# Patient Record
Sex: Male | Born: 1950 | Race: White | Hispanic: No | Marital: Married | Smoking: Former smoker
Health system: Southern US, Community
[De-identification: ages and names within clinical notes are randomized; demographics above are authoritative.]

## PROBLEM LIST (undated history)

## (undated) DIAGNOSIS — Z7982 Long term (current) use of aspirin: Secondary | ICD-10-CM

## (undated) DIAGNOSIS — L570 Actinic keratosis: Secondary | ICD-10-CM

## (undated) DIAGNOSIS — R200 Anesthesia of skin: Secondary | ICD-10-CM

## (undated) DIAGNOSIS — K219 Gastro-esophageal reflux disease without esophagitis: Secondary | ICD-10-CM

## (undated) DIAGNOSIS — I272 Pulmonary hypertension, unspecified: Secondary | ICD-10-CM

## (undated) DIAGNOSIS — I6789 Other cerebrovascular disease: Secondary | ICD-10-CM

## (undated) DIAGNOSIS — I5189 Other ill-defined heart diseases: Secondary | ICD-10-CM

## (undated) DIAGNOSIS — Z87442 Personal history of urinary calculi: Secondary | ICD-10-CM

## (undated) DIAGNOSIS — K7689 Other specified diseases of liver: Secondary | ICD-10-CM

## (undated) DIAGNOSIS — M47816 Spondylosis without myelopathy or radiculopathy, lumbar region: Secondary | ICD-10-CM

## (undated) DIAGNOSIS — Z860101 Personal history of adenomatous and serrated colon polyps: Secondary | ICD-10-CM

## (undated) DIAGNOSIS — D329 Benign neoplasm of meninges, unspecified: Secondary | ICD-10-CM

## (undated) DIAGNOSIS — G4733 Obstructive sleep apnea (adult) (pediatric): Secondary | ICD-10-CM

## (undated) DIAGNOSIS — C4492 Squamous cell carcinoma of skin, unspecified: Secondary | ICD-10-CM

## (undated) DIAGNOSIS — I10 Essential (primary) hypertension: Secondary | ICD-10-CM

## (undated) DIAGNOSIS — E782 Mixed hyperlipidemia: Secondary | ICD-10-CM

## (undated) DIAGNOSIS — N281 Cyst of kidney, acquired: Secondary | ICD-10-CM

## (undated) DIAGNOSIS — I2510 Atherosclerotic heart disease of native coronary artery without angina pectoris: Secondary | ICD-10-CM

## (undated) DIAGNOSIS — R600 Localized edema: Secondary | ICD-10-CM

## (undated) DIAGNOSIS — Z8719 Personal history of other diseases of the digestive system: Secondary | ICD-10-CM

## (undated) DIAGNOSIS — N433 Hydrocele, unspecified: Secondary | ICD-10-CM

## (undated) DIAGNOSIS — R42 Dizziness and giddiness: Secondary | ICD-10-CM

## (undated) DIAGNOSIS — H409 Unspecified glaucoma: Secondary | ICD-10-CM

## (undated) DIAGNOSIS — M1712 Unilateral primary osteoarthritis, left knee: Secondary | ICD-10-CM

## (undated) DIAGNOSIS — N529 Male erectile dysfunction, unspecified: Secondary | ICD-10-CM

## (undated) DIAGNOSIS — N200 Calculus of kidney: Secondary | ICD-10-CM

## (undated) DIAGNOSIS — R0609 Other forms of dyspnea: Secondary | ICD-10-CM

## (undated) DIAGNOSIS — A6000 Herpesviral infection of urogenital system, unspecified: Secondary | ICD-10-CM

## (undated) DIAGNOSIS — R918 Other nonspecific abnormal finding of lung field: Secondary | ICD-10-CM

## (undated) DIAGNOSIS — R001 Bradycardia, unspecified: Secondary | ICD-10-CM

## (undated) DIAGNOSIS — I7300 Raynaud's syndrome without gangrene: Secondary | ICD-10-CM

## (undated) DIAGNOSIS — K2210 Ulcer of esophagus without bleeding: Secondary | ICD-10-CM

## (undated) DIAGNOSIS — K449 Diaphragmatic hernia without obstruction or gangrene: Secondary | ICD-10-CM

## (undated) DIAGNOSIS — Z8546 Personal history of malignant neoplasm of prostate: Secondary | ICD-10-CM

## (undated) DIAGNOSIS — I700 Atherosclerosis of aorta: Secondary | ICD-10-CM

## (undated) DIAGNOSIS — K2270 Barrett's esophagus without dysplasia: Secondary | ICD-10-CM

## (undated) DIAGNOSIS — Z8589 Personal history of malignant neoplasm of other organs and systems: Secondary | ICD-10-CM

## (undated) DIAGNOSIS — R4189 Other symptoms and signs involving cognitive functions and awareness: Secondary | ICD-10-CM

## (undated) DIAGNOSIS — C801 Malignant (primary) neoplasm, unspecified: Secondary | ICD-10-CM

## (undated) HISTORY — PX: BACK SURGERY: SHX140

## (undated) HISTORY — PX: OTHER SURGICAL HISTORY: SHX169

## (undated) HISTORY — PX: ESOPHAGOGASTRODUODENOSCOPY: SHX1529

## (undated) HISTORY — PX: HERNIA REPAIR: SHX51

## (undated) HISTORY — PX: COLONOSCOPY: SHX174

## (undated) HISTORY — PX: INGUINAL HERNIA REPAIR: SHX194

## (undated) HISTORY — PX: CARPAL TUNNEL RELEASE: SHX101

---
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
## 1977-05-19 HISTORY — PX: KNEE ARTHROSCOPY: SUR90

## 1981-05-19 HISTORY — PX: OTHER SURGICAL HISTORY: SHX169

## 2004-10-17 HISTORY — PX: OTHER SURGICAL HISTORY: SHX169

## 2004-10-28 ENCOUNTER — Ambulatory Visit: Payer: Self-pay | Admitting: Unknown Physician Specialty

## 2005-05-16 ENCOUNTER — Ambulatory Visit: Payer: Self-pay | Admitting: General Surgery

## 2005-06-24 ENCOUNTER — Ambulatory Visit: Payer: Self-pay | Admitting: General Surgery

## 2005-08-27 ENCOUNTER — Other Ambulatory Visit: Payer: Self-pay

## 2005-08-27 ENCOUNTER — Emergency Department: Payer: Self-pay | Admitting: Emergency Medicine

## 2008-05-19 DIAGNOSIS — Z8546 Personal history of malignant neoplasm of prostate: Secondary | ICD-10-CM

## 2008-05-19 HISTORY — DX: Personal history of malignant neoplasm of prostate: Z85.46

## 2008-06-19 HISTORY — PX: OTHER SURGICAL HISTORY: SHX169

## 2008-06-19 HISTORY — PX: PROSTATECTOMY: SHX69

## 2008-07-24 ENCOUNTER — Ambulatory Visit: Payer: Self-pay | Admitting: Family Medicine

## 2008-08-29 ENCOUNTER — Ambulatory Visit: Payer: Self-pay | Admitting: Urology

## 2008-09-05 ENCOUNTER — Ambulatory Visit: Payer: Self-pay | Admitting: Cardiology

## 2008-10-27 ENCOUNTER — Ambulatory Visit: Payer: Self-pay | Admitting: Urology

## 2008-11-07 ENCOUNTER — Inpatient Hospital Stay: Payer: Self-pay | Admitting: Urology

## 2009-11-12 ENCOUNTER — Ambulatory Visit: Payer: Self-pay | Admitting: General Surgery

## 2009-12-03 ENCOUNTER — Ambulatory Visit: Payer: Self-pay | Admitting: General Surgery

## 2010-04-26 ENCOUNTER — Ambulatory Visit: Payer: Self-pay | Admitting: Unknown Physician Specialty

## 2015-01-23 ENCOUNTER — Other Ambulatory Visit: Payer: Self-pay | Admitting: Adult Health

## 2015-01-23 DIAGNOSIS — R17 Unspecified jaundice: Secondary | ICD-10-CM

## 2015-01-23 DIAGNOSIS — R1011 Right upper quadrant pain: Secondary | ICD-10-CM

## 2015-01-25 ENCOUNTER — Ambulatory Visit
Admission: RE | Admit: 2015-01-25 | Discharge: 2015-01-25 | Disposition: A | Discharge status: Home / Self Care | Payer: Managed Care, Other (non HMO) | Source: Ambulatory Visit | Attending: Family Medicine | Admitting: Family Medicine

## 2015-01-25 DIAGNOSIS — R17 Unspecified jaundice: Secondary | ICD-10-CM

## 2015-01-25 DIAGNOSIS — R1011 Right upper quadrant pain: Secondary | ICD-10-CM | POA: Diagnosis present

## 2016-09-16 DEATH — deceased

## 2018-11-24 HISTORY — PX: OTHER SURGICAL HISTORY: SHX169

## 2019-07-24 ENCOUNTER — Ambulatory Visit: Payer: Managed Care, Other (non HMO) | Attending: Internal Medicine

## 2019-07-24 DIAGNOSIS — Z23 Encounter for immunization: Secondary | ICD-10-CM

## 2019-07-24 NOTE — Progress Notes (Signed)
   Covid-19 Vaccination Clinic  Name:  Vincent Patton    MRN: [id number] DOB: 07-20-1950  07/24/2019  [name] was observed post Covid-19 immunization for 15 minutes without incident. He was provided with Vaccine Information Sheet and instruction to access the V-Safe system.   [name] was instructed to call 911 with any severe reactions post vaccine: Marland Kitchen Difficulty breathing  . Swelling of face and throat  . A fast heartbeat  . A bad rash all over body  . Dizziness and weakness   Immunizations Administered    Name Date Dose VIS Date Route   Pfizer COVID-19 Vaccine 07/24/2019  9:06 AM 0.3 mL 04/29/2019 Intramuscular   Manufacturer: [employer], [employer]   Lot: [id number]   NDC: [address]-4

## 2019-08-15 ENCOUNTER — Ambulatory Visit: Payer: Managed Care, Other (non HMO) | Attending: Internal Medicine

## 2019-08-15 DIAGNOSIS — Z23 Encounter for immunization: Secondary | ICD-10-CM

## 2019-08-15 NOTE — Progress Notes (Signed)
   Covid-19 Vaccination Clinic  Name:  Vincent Patton    MRN: [id number] DOB: [birth year]  08/15/2019  [name] was observed post Covid-19 immunization for 15 minutes without incident. He was provided with Vaccine Information Sheet and instruction to access the V-Safe system.   [name] was instructed to call 911 with any severe reactions post vaccine: Marland Kitchen Difficulty breathing  . Swelling of face and throat  . A fast heartbeat  . A bad rash all over body  . Dizziness and weakness   Immunizations Administered    Name Date Dose VIS Date Route   Pfizer COVID-19 Vaccine 08/15/2019  8:33 AM 0.3 mL 04/29/2019 Intramuscular   Manufacturer: [employer], [employer]   Lot: [id number]   NDC: [address]-8

## 2020-04-23 ENCOUNTER — Ambulatory Visit: Payer: Medicare Other | Attending: Neurology

## 2020-04-23 DIAGNOSIS — G4761 Periodic limb movement disorder: Secondary | ICD-10-CM | POA: Insufficient documentation

## 2020-04-23 DIAGNOSIS — G4733 Obstructive sleep apnea (adult) (pediatric): Secondary | ICD-10-CM | POA: Insufficient documentation

## 2020-04-23 DIAGNOSIS — R0683 Snoring: Secondary | ICD-10-CM | POA: Diagnosis present

## 2020-04-24 ENCOUNTER — Other Ambulatory Visit: Payer: Self-pay

## 2020-08-24 ENCOUNTER — Other Ambulatory Visit: Payer: Self-pay

## 2020-08-24 ENCOUNTER — Emergency Department: Payer: Medicare Other

## 2020-08-24 ENCOUNTER — Emergency Department
Admission: EM | Admit: 2020-08-24 | Discharge: 2020-08-24 | Disposition: A | Discharge status: Home / Self Care | Payer: Medicare Other | Attending: Emergency Medicine | Admitting: Emergency Medicine

## 2020-08-24 ENCOUNTER — Encounter: Payer: Self-pay | Admitting: Intensive Care

## 2020-08-24 DIAGNOSIS — Z87891 Personal history of nicotine dependence: Secondary | ICD-10-CM | POA: Diagnosis not present

## 2020-08-24 DIAGNOSIS — R0789 Other chest pain: Secondary | ICD-10-CM | POA: Diagnosis not present

## 2020-08-24 DIAGNOSIS — R079 Chest pain, unspecified: Secondary | ICD-10-CM

## 2020-08-24 LAB — CBC
HCT: 43.6 % (ref 39.0–52.0)
Hemoglobin: 14.9 g/dL (ref 13.0–17.0)
MCH: 31.2 pg (ref 26.0–34.0)
MCHC: 34.2 g/dL (ref 30.0–36.0)
MCV: 91.4 fL (ref 80.0–100.0)
Platelets: 141 10*3/uL — ABNORMAL LOW (ref 150–400)
RBC: 4.77 MIL/uL (ref 4.22–5.81)
RDW: 12.3 % (ref 11.5–15.5)
WBC: 5 10*3/uL (ref 4.0–10.5)
nRBC: 0 % (ref 0.0–0.2)

## 2020-08-24 LAB — BASIC METABOLIC PANEL
Anion gap: 5 (ref 5–15)
BUN: 13 mg/dL (ref 8–23)
CO2: 28 mmol/L (ref 22–32)
Calcium: 8.6 mg/dL — ABNORMAL LOW (ref 8.9–10.3)
Chloride: 105 mmol/L (ref 98–111)
Creatinine, Ser: 0.84 mg/dL (ref 0.61–1.24)
GFR, Estimated: >60 mL/min (ref >60)
Glucose, Bld: 168 mg/dL — ABNORMAL HIGH (ref 70–99)
Potassium: 3.5 mmol/L (ref 3.5–5.1)
Sodium: 138 mmol/L (ref 135–145)

## 2020-08-24 LAB — TROPONIN I (HIGH SENSITIVITY)
Troponin I (High Sensitivity): 12 ng/L (ref <18)
Troponin I (High Sensitivity): 16 ng/L (ref <18)

## 2020-08-24 NOTE — ED Triage Notes (Signed)
Pt c/o central cp for a few days with no radiation. Describes as dull ache

## 2020-08-24 NOTE — ED Provider Notes (Signed)
[facility] Emergency Department Provider Note   ____________________________________________   I have reviewed the triage vital signs and the nursing notes.   HISTORY  Chief Complaint Chest Pain   History limited by: Not Limited   HPI Vincent Patton is a 70 y.o. male who presents to the emergency department today because of concern for chest discomfort. Located in the center part of his chest. It does not radiate to his jaw or arm. He states that it is intermittent. It will come on with exertion and go away with rest. He denies any associated nausea, shortness of breath or diaphoresis. It has been happening over the past few weeks. The patient denies any fevers. No swelling. Does have a history of acid reflux and recently started on antacid which has been helping with that.    Records reviewed. Per medical record review patient has a history of GERD  History reviewed. No pertinent past medical history.  There are no problems to display for this patient.   History reviewed. No pertinent surgical history.  Prior to Admission medications   Not on File    Allergies Lodine [etodolac]  History reviewed. No pertinent family history.  Social History Social History   Tobacco Use  . Smoking status: Former Smoker    Types: Cigarettes  . Smokeless tobacco: Never Used  Substance Use Topics  . Alcohol use: Yes    Comment: beer once a month  . Drug use: Never    Review of Systems Constitutional: No fever/chills Eyes: No visual changes. ENT: No sore throat. Cardiovascular: Positive for chest pain. Respiratory: Denies shortness of breath. Gastrointestinal: No abdominal pain.  No nausea, no vomiting.  No diarrhea.   Genitourinary: Negative for dysuria. Musculoskeletal: Negative for back pain. Skin: Negative for rash. Neurological: Negative for headaches, focal weakness or numbness.  ____________________________________________   PHYSICAL  EXAM:  VITAL SIGNS: ED Triage Vitals  Enc Vitals Group     BP 08/24/20 1525 (!) 154/85     Pulse Rate 08/24/20 1525 84     Resp 08/24/20 1525 20     Temp 08/24/20 1525 98.2 F (36.8 C)     Temp Source 08/24/20 1525 Oral     SpO2 08/24/20 1525 96 %     Weight 08/24/20 1526 180 lb (81.6 kg)     Height 08/24/20 1526 6' (1.829 m)     Head Circumference --      Peak Flow --      Pain Score 08/24/20 1525 4   Constitutional: Alert and oriented.  Eyes: Conjunctivae are normal.  ENT      Head: Normocephalic and atraumatic.      Nose: No congestion/rhinnorhea.      Mouth/Throat: Mucous membranes are moist.      Neck: No stridor. Hematological/Lymphatic/Immunilogical: No cervical lymphadenopathy. Cardiovascular: Normal rate, regular rhythm.  No murmurs, rubs, or gallops.  Respiratory: Normal respiratory effort without tachypnea nor retractions. Breath sounds are clear and equal bilaterally. No wheezes/rales/rhonchi. Gastrointestinal: Soft and non tender. No rebound. No guarding.  Genitourinary: Deferred Musculoskeletal: Normal range of motion in all extremities. No lower extremity edema. Neurologic:  Normal speech and language. No gross focal neurologic deficits are appreciated.  Skin:  Skin is warm, dry and intact. No rash noted. Psychiatric: Mood and affect are normal. Speech and behavior are normal. Patient exhibits appropriate insight and judgment.  ____________________________________________    LABS (pertinent positives/negatives)  CBC wbc 5.0, hgb 14.9, plt 141 Trop hs 12 to  16 CMP wnl except glu 168, ca 8.6 ____________________________________________   EKG  I, Phineas Semen, attending physician, personally viewed and interpreted this EKG  EKG Time: 1520 Rate: 79 Rhythm: normal sinus rhythm Axis: normal Intervals: qtc 433 QRS: narrow ST changes: no st elevation, t wave inversion V1 Impression: abnormal ekg  ____________________________________________     RADIOLOGY  CXR No acute abnormality  ____________________________________________   PROCEDURES  Procedures  ____________________________________________   INITIAL IMPRESSION / ASSESSMENT AND PLAN / ED COURSE  Pertinent labs & imaging results that were available during my care of the patient were reviewed by me and considered in my medical decision making (see chart for details).   Patient presented to the emergency department today because of concern for chest pain. It has been intermittent over the past few weeks. On exam today patient without any chest discomfort. Troponin negative x 2. EKG without concerning acute findings. At this time somewhat unclear etiology however I did discuss with the patient importance of cardiology follow up. We discussed starting a low dose aspirin and I do not think there would be any concern for taking it at this time. Discussed return precautions.  ____________________________________________   FINAL CLINICAL IMPRESSION(S) / ED DIAGNOSES  Final diagnoses:  Nonspecific chest pain     Note: This dictation was prepared with Dragon dictation. Any transcriptional errors that result from this process are unintentional     Phineas Semen, MD 08/24/20 1806

## 2020-08-24 NOTE — Discharge Instructions (Addendum)
As we discussed please contact Dr. America Brown office for close follow up. You can also start taking low dose aspirin (81mg ) daily. Please seek medical attention for any worsening chest pain, shortness of breath, pain in your jaw or arm, unusual sweating or any other new or concerning symptoms.

## 2020-08-24 NOTE — ED Notes (Signed)
continuous cardiac and pulse ox monitoring in use.

## 2021-07-29 ENCOUNTER — Other Ambulatory Visit: Payer: Self-pay | Admitting: Physician Assistant

## 2021-07-29 DIAGNOSIS — R0789 Other chest pain: Secondary | ICD-10-CM

## 2021-07-29 DIAGNOSIS — R6889 Other general symptoms and signs: Secondary | ICD-10-CM

## 2021-07-29 DIAGNOSIS — I10 Essential (primary) hypertension: Secondary | ICD-10-CM

## 2021-08-01 ENCOUNTER — Ambulatory Visit
Admission: RE | Admit: 2021-08-01 | Discharge: 2021-08-01 | Disposition: A | Discharge status: Home / Self Care | Payer: Medicare Other | Source: Ambulatory Visit | Attending: Physician Assistant | Admitting: Physician Assistant

## 2021-08-01 ENCOUNTER — Other Ambulatory Visit: Payer: Self-pay | Admitting: Physician Assistant

## 2021-08-01 DIAGNOSIS — R6889 Other general symptoms and signs: Secondary | ICD-10-CM | POA: Diagnosis not present

## 2021-08-01 DIAGNOSIS — Z87891 Personal history of nicotine dependence: Secondary | ICD-10-CM | POA: Insufficient documentation

## 2021-08-01 DIAGNOSIS — R0601 Orthopnea: Secondary | ICD-10-CM | POA: Insufficient documentation

## 2021-08-01 DIAGNOSIS — E785 Hyperlipidemia, unspecified: Secondary | ICD-10-CM | POA: Diagnosis not present

## 2021-08-01 DIAGNOSIS — I10 Essential (primary) hypertension: Secondary | ICD-10-CM | POA: Insufficient documentation

## 2021-08-01 DIAGNOSIS — K219 Gastro-esophageal reflux disease without esophagitis: Secondary | ICD-10-CM | POA: Diagnosis not present

## 2021-08-01 DIAGNOSIS — R0602 Shortness of breath: Secondary | ICD-10-CM | POA: Insufficient documentation

## 2021-08-01 DIAGNOSIS — R931 Abnormal findings on diagnostic imaging of heart and coronary circulation: Secondary | ICD-10-CM | POA: Insufficient documentation

## 2021-08-01 DIAGNOSIS — R002 Palpitations: Secondary | ICD-10-CM | POA: Insufficient documentation

## 2021-08-01 DIAGNOSIS — I498 Other specified cardiac arrhythmias: Secondary | ICD-10-CM | POA: Diagnosis not present

## 2021-08-01 DIAGNOSIS — I700 Atherosclerosis of aorta: Secondary | ICD-10-CM | POA: Insufficient documentation

## 2021-08-01 DIAGNOSIS — Z8546 Personal history of malignant neoplasm of prostate: Secondary | ICD-10-CM | POA: Insufficient documentation

## 2021-08-01 DIAGNOSIS — R9389 Abnormal findings on diagnostic imaging of other specified body structures: Secondary | ICD-10-CM | POA: Diagnosis present

## 2021-08-01 DIAGNOSIS — Z79899 Other long term (current) drug therapy: Secondary | ICD-10-CM | POA: Diagnosis not present

## 2021-08-01 DIAGNOSIS — K449 Diaphragmatic hernia without obstruction or gangrene: Secondary | ICD-10-CM | POA: Insufficient documentation

## 2021-08-01 DIAGNOSIS — Z85828 Personal history of other malignant neoplasm of skin: Secondary | ICD-10-CM | POA: Insufficient documentation

## 2021-08-01 DIAGNOSIS — R918 Other nonspecific abnormal finding of lung field: Secondary | ICD-10-CM | POA: Diagnosis not present

## 2021-08-01 DIAGNOSIS — I2510 Atherosclerotic heart disease of native coronary artery without angina pectoris: Secondary | ICD-10-CM | POA: Diagnosis not present

## 2021-08-01 DIAGNOSIS — Z8249 Family history of ischemic heart disease and other diseases of the circulatory system: Secondary | ICD-10-CM | POA: Diagnosis not present

## 2021-08-01 DIAGNOSIS — R0789 Other chest pain: Secondary | ICD-10-CM | POA: Diagnosis present

## 2021-08-01 DIAGNOSIS — R42 Dizziness and giddiness: Secondary | ICD-10-CM | POA: Insufficient documentation

## 2021-08-01 DIAGNOSIS — R0609 Other forms of dyspnea: Secondary | ICD-10-CM | POA: Insufficient documentation

## 2021-08-01 DIAGNOSIS — Z77090 Contact with and (suspected) exposure to asbestos: Secondary | ICD-10-CM | POA: Insufficient documentation

## 2021-08-01 LAB — POCT I-STAT CREATININE: Creatinine, Ser: 0.8 mg/dL (ref 0.61–1.24)

## 2021-08-01 MED ORDER — IOHEXOL 350 MG/ML SOLN
80.0000 mL | Freq: Once | INTRAVENOUS | Status: AC | PRN
  Administered 2021-08-01: 75 mL via INTRAVENOUS

## 2021-08-01 MED ORDER — NITROGLYCERIN 0.4 MG SL SUBL
0.8000 mg | SUBLINGUAL_TABLET | Freq: Once | SUBLINGUAL | Status: AC
  Administered 2021-08-01: 0.8 mg via SUBLINGUAL

## 2021-08-01 MED ORDER — METOPROLOL TARTRATE 5 MG/5ML IV SOLN
10.0000 mg | Freq: Once | INTRAVENOUS | Status: AC
  Administered 2021-08-01: 10 mg via INTRAVENOUS

## 2021-08-01 NOTE — Progress Notes (Signed)
Patient tolerated CT well. Drank water after. Vital signs stable encourage to drink water throughout day.Reasons explained and verbalized understanding. Ambulated steady gait.  

## 2021-08-02 DIAGNOSIS — R931 Abnormal findings on diagnostic imaging of heart and coronary circulation: Secondary | ICD-10-CM | POA: Diagnosis not present

## 2022-02-20 ENCOUNTER — Other Ambulatory Visit: Payer: Self-pay | Admitting: Neurology

## 2022-02-20 ENCOUNTER — Other Ambulatory Visit (HOSPITAL_COMMUNITY): Payer: Self-pay | Admitting: Neurology

## 2022-02-20 DIAGNOSIS — R4189 Other symptoms and signs involving cognitive functions and awareness: Secondary | ICD-10-CM

## 2022-02-24 ENCOUNTER — Ambulatory Visit (HOSPITAL_COMMUNITY)
Admission: RE | Admit: 2022-02-24 | Discharge: 2022-02-24 | Disposition: A | Discharge status: Home / Self Care | Payer: Medicare Other | Source: Ambulatory Visit | Attending: Neurology | Admitting: Neurology

## 2022-02-24 DIAGNOSIS — R4189 Other symptoms and signs involving cognitive functions and awareness: Secondary | ICD-10-CM | POA: Insufficient documentation

## 2022-04-22 ENCOUNTER — Encounter: Payer: Self-pay | Admitting: Podiatry

## 2022-04-22 ENCOUNTER — Ambulatory Visit: Payer: Medicare Other | Admitting: Podiatry

## 2022-04-22 VITALS — BP 170/97 | HR 64

## 2022-04-22 DIAGNOSIS — S99929A Unspecified injury of unspecified foot, initial encounter: Secondary | ICD-10-CM

## 2022-04-22 NOTE — Progress Notes (Signed)
   Chief Complaint  Patient presents with   Foot Pain    "I'm having problems with my big toenail." N - toenail problem L - hallux rt D - 3 weeks O - suddenly C - hurts sometime, discolored A - walking sometime T - my wife dug in it a little bit    HPI: 71 y.o. male presenting today as a new patient for evaluation of discoloration to the right hallux nail plate that began about 3 weeks ago.  Patient states that about 4 weeks ago he sustained a trip and fall injury.  This may have elicited the discoloration of the toenail plate.  It is slightly tender.  He presents for further treatment and evaluation  No past medical history on file.  No past surgical history on file.  Allergies  Allergen Reactions   Lodine [Etodolac]     Physical Exam: General: The patient is alert and oriented x3 in no acute distress.  Dermatology: Discoloration noted to the right hallux nail plate with some dried subungual blood.  Clinically this looks like history of trauma which correlates with the patient's history of the trip and fall injury about 4 weeks ago.  It is well adhered to the underlying nailbed  Vascular: Palpable pedal pulses bilaterally. Capillary refill within normal limits.  Negative for any significant edema or erythema  Neurological: Grossly intact via light touch  Musculoskeletal Exam: No pedal deformities noted   Assessment: 1.  Traumatic injury to right hallux nail plate; stable   Plan of Care:  1. Patient evaluated.  2.  Light debridement of the nail plate was performed today using a nail nipper without incident or bleeding 3.  The toenail plate is stable.  Recommend conservative treatment and simple observation as the nail grows out 4.  Return to clinic as needed     Felecia Shelling, DPM [facility]  Dr. Felecia Shelling, DPM    [address] [place], [place] [address]                Office [phone]  Fax [phone]

## 2022-05-29 ENCOUNTER — Other Ambulatory Visit: Payer: Self-pay | Admitting: Physician Assistant

## 2022-05-29 DIAGNOSIS — Z86018 Personal history of other benign neoplasm: Secondary | ICD-10-CM

## 2022-05-29 DIAGNOSIS — M5136 Other intervertebral disc degeneration, lumbar region: Secondary | ICD-10-CM

## 2022-05-29 DIAGNOSIS — M5416 Radiculopathy, lumbar region: Secondary | ICD-10-CM

## 2022-05-29 DIAGNOSIS — M51369 Other intervertebral disc degeneration, lumbar region without mention of lumbar back pain or lower extremity pain: Secondary | ICD-10-CM

## 2022-06-12 ENCOUNTER — Ambulatory Visit
Admission: RE | Admit: 2022-06-12 | Discharge: 2022-06-12 | Disposition: A | Discharge status: Home / Self Care | Payer: Medicare Other | Source: Ambulatory Visit | Attending: Physician Assistant | Admitting: Physician Assistant

## 2022-06-12 DIAGNOSIS — Z86018 Personal history of other benign neoplasm: Secondary | ICD-10-CM

## 2022-06-12 DIAGNOSIS — M5136 Other intervertebral disc degeneration, lumbar region: Secondary | ICD-10-CM

## 2022-06-12 DIAGNOSIS — M5416 Radiculopathy, lumbar region: Secondary | ICD-10-CM

## 2022-06-12 MED ORDER — GADOBENATE DIMEGLUMINE 529 MG/ML IV SOLN
15.0000 mL | Freq: Once | INTRAVENOUS | Status: AC | PRN
  Administered 2022-06-12: 15 mL via INTRAVENOUS

## 2022-12-30 ENCOUNTER — Other Ambulatory Visit: Payer: Self-pay | Admitting: Student

## 2022-12-30 DIAGNOSIS — D329 Benign neoplasm of meninges, unspecified: Secondary | ICD-10-CM

## 2023-01-01 ENCOUNTER — Ambulatory Visit
Admission: RE | Admit: 2023-01-01 | Discharge: 2023-01-01 | Disposition: A | Discharge status: Home / Self Care | Payer: Medicare Other | Source: Ambulatory Visit | Attending: Student | Admitting: Student

## 2023-01-01 DIAGNOSIS — D329 Benign neoplasm of meninges, unspecified: Secondary | ICD-10-CM | POA: Insufficient documentation

## 2023-01-01 MED ORDER — GADOBUTROL 1 MMOL/ML IV SOLN
6.0000 mL | Freq: Once | INTRAVENOUS | Status: AC | PRN
  Administered 2023-01-01: 6 mL via INTRAVENOUS

## 2023-05-15 ENCOUNTER — Ambulatory Visit: Payer: Medicare Other | Admitting: Urology

## 2023-05-15 ENCOUNTER — Encounter: Payer: Self-pay | Admitting: Urology

## 2023-05-15 VITALS — Ht 72.0 in | Wt 180.0 lb

## 2023-05-15 DIAGNOSIS — Z8546 Personal history of malignant neoplasm of prostate: Secondary | ICD-10-CM | POA: Diagnosis not present

## 2023-05-15 DIAGNOSIS — R399 Unspecified symptoms and signs involving the genitourinary system: Secondary | ICD-10-CM | POA: Diagnosis not present

## 2023-05-15 LAB — URINALYSIS, COMPLETE
Bilirubin, UA: NEGATIVE
Glucose, UA: NEGATIVE
Ketones, UA: NEGATIVE
Leukocytes,UA: NEGATIVE
Nitrite, UA: NEGATIVE
Protein,UA: NEGATIVE
RBC, UA: NEGATIVE
Specific Gravity, UA: 1.025 (ref 1.005–1.030)
Urobilinogen, Ur: 0.2 mg/dL (ref 0.2–1.0)
pH, UA: 5.5 (ref 5.0–7.5)

## 2023-05-15 LAB — MICROSCOPIC EXAMINATION

## 2023-05-15 LAB — BLADDER SCAN AMB NON-IMAGING: Scan Result: 0

## 2023-05-15 NOTE — Progress Notes (Signed)
I, Maysun Anabel Bene, acting as a scribe for Riki Altes, MD., have documented all relevant documentation on the behalf of Riki Altes, MD, as directed by Riki Altes, MD while in the presence of Riki Altes, MD.  05/15/2023 10:14 AM   Vincent Patton 1950-08-13 [id number]  Chief Complaint  Patient presents with   Urinary Frequency    HPI: Vincent Patton is a 72 y.o. male self-referred for evaluation of urinary hesitancy and a weak urinary stream.  Status post radical prostatectomy around 2010 by Dr. Achilles Dunk. He had no post-operative problems, urinary incontinence, urethra stricture bladder neck contracture.  1 week prior to Thanksgiving, he had onset of urinary hesitancy and a slow urinary stream, along with nocturia x2. He saw PCP and had negative urinalysis and urine culture. Symptoms have subsequently resolved and he currently has no complaints. He has a flat decreased urinary stream, which is baseline, but is able to empty his bladder. No dysuria, gross hematuria.  He has ED and takes tadalafil. He could not tolerate thetadalafil secondary to back pain. PSA June 2024 was undetectable at <0.01   Home Medications:  Allergies as of 05/15/2023       Reactions   Tamsulosin Other (See Comments), Shortness Of Breath   Feels like he will pass out while on it with SOB   Lodine [etodolac]         Medication List        Accurate as of May 15, 2023 10:14 AM. If you have any questions, ask your nurse or doctor.          amLODipine 5 MG tablet Commonly known as: NORVASC Take 5 mg by mouth daily.   aspirin 81 MG chewable tablet Chew by mouth.   cyanocobalamin 1000 MCG tablet Commonly known as: VITAMIN B12 Take by mouth.   memantine 5 MG tablet Commonly known as: NAMENDA Take 2.5 mg by mouth 2 (two) times daily.   oxybutynin 5 MG 24 hr tablet Commonly known as: DITROPAN-XL Take by mouth.   pantoprazole 40 MG tablet Commonly known as: PROTONIX Take  40 mg by mouth daily.   potassium chloride 10 MEQ tablet Commonly known as: KLOR-CON M Take by mouth.   sildenafil 20 MG tablet Commonly known as: REVATIO TAKE 2-5 TABLETS BY MOUTH ONCE DAILY AS NEEDED FOR SEXUAL ACTIVITY        Allergies:  Allergies  Allergen Reactions   Tamsulosin Other (See Comments) and Shortness Of Breath    Feels like he will pass out while on it with SOB   Lodine [Etodolac]     Social History:  reports that he has quit smoking. His smoking use included cigarettes. He has never used smokeless tobacco. He reports current alcohol use. He reports that he does not use drugs.   Physical Exam: Ht 6' (1.829 m)   Wt 180 lb (81.6 kg)   BMI 24.41 kg/m   Constitutional:  Alert and oriented, No acute distress. HEENT: [place] AT Respiratory: Normal respiratory effort, no increased work of breathing. Psychiatric: Normal mood and affect.  Urinalysis Dipstick/microscopy negative.   Assessment & Plan:    1. Lower urinary tract symptoms His symptoms he was experiencing last month have resolved. He has slight decreased urinary stream, however PVR today was 0 mL.  He will follow up as needed for recurrent lower urinary tract symptoms.   2. History of prostate cancer Undetectable PSA, approximately 15 years post-op.   I  have reviewed the above documentation for accuracy and completeness, and I agree with the above.   Riki Altes, MD  [facility] [address], [address] [place], [place] [address] [phone]

## 2023-06-04 ENCOUNTER — Encounter: Payer: Self-pay | Admitting: Urology

## 2023-06-04 ENCOUNTER — Ambulatory Visit: Payer: Medicare Other | Admitting: Urology

## 2023-06-04 VITALS — BP 129/78 | HR 67 | Ht 72.0 in | Wt 180.0 lb

## 2023-06-04 DIAGNOSIS — N5201 Erectile dysfunction due to arterial insufficiency: Secondary | ICD-10-CM

## 2023-06-04 DIAGNOSIS — N433 Hydrocele, unspecified: Secondary | ICD-10-CM | POA: Diagnosis not present

## 2023-06-04 NOTE — Progress Notes (Signed)
I, Maysun Anabel Bene, acting as a scribe for Riki Altes, MD., have documented all relevant documentation on the behalf of Riki Altes, MD,as directed by Riki Altes, MD while in the presence of Riki Altes, MD.  06/04/2023 8:58 PM   Maryclare Labrador [birth year] [id number]  Referring provider: Dorothey Baseman, MD [phone] [place],  [place] [address]  Chief Complaint  Patient presents with   Other   Urologic history: 1. History of prostate cancer  HPI: CHERYL PEFLEY is a 73 y.o. male for a follow-up visit for an evaluation of left hemiscrotal swelling.  Seen 05/15/2023 for a urinary hesitancy. He states he has had a several year history of left  left hemiscrotal swelling but did not mention at that visit. He has mild discomfort on occasions He also is using sildenafil up to 100 mg. Initially gets a good erection, however has difficulty maintaining the erection. He is unable to tolerate tadalafil, secondary to significant back pain.    Home Medications:  Allergies as of 06/04/2023       Reactions   Tamsulosin Other (See Comments), Shortness Of Breath   Feels like he will pass out while on it with SOB   Lodine [etodolac]         Medication List        Accurate as of June 04, 2023  8:58 PM. If you have any questions, ask your nurse or doctor.          amLODipine 5 MG tablet Commonly known as: NORVASC Take 5 mg by mouth daily.   aspirin 81 MG chewable tablet Chew by mouth.   cyanocobalamin 1000 MCG tablet Commonly known as: VITAMIN B12 Take by mouth.   memantine 5 MG tablet Commonly known as: NAMENDA Take 2.5 mg by mouth 2 (two) times daily.   oxybutynin 5 MG 24 hr tablet Commonly known as: DITROPAN-XL Take by mouth.   pantoprazole 40 MG tablet Commonly known as: PROTONIX Take 40 mg by mouth daily.   potassium chloride 10 MEQ tablet Commonly known as: KLOR-CON M Take by mouth.   sildenafil 20 MG tablet Commonly known as:  REVATIO TAKE 2-5 TABLETS BY MOUTH ONCE DAILY AS NEEDED FOR SEXUAL ACTIVITY        Allergies:  Allergies  Allergen Reactions   Tamsulosin Other (See Comments) and Shortness Of Breath    Feels like he will pass out while on it with SOB   Lodine [Etodolac]     Social History:  reports that he has quit smoking. His smoking use included cigarettes. He has never used smokeless tobacco. He reports current alcohol use. He reports that he does not use drugs.   Physical Exam: BP 129/78   Pulse 67   Ht 6' (1.829 m)   Wt 180 lb (81.6 kg)   BMI 24.41 kg/m   Constitutional:  Alert and oriented, No acute distress. HEENT: [place] AT Respiratory: Normal respiratory effort, no increased work of breathing. GU: Phallus without lesions. Right testes descended palpably normal. There is a small left hydrocele present, which is compressible and able to palpate the testes.  Skin: No rashes, bruises or suspicious lesions. Neurologic: Grossly intact, no focal deficits, moving all 4 extremities. Psychiatric: Normal mood and affect.  Assessment & Plan:    1. Left hydrocele The pathophysiology of hydrocele formation was discussed.  Options were discussed including observation, aspiration, and hydrocelectomy. The high recurrence rate of aspiration was discussed.  He  is minimally symptomatic and has elected observation.  He would like a 1 year follow-up for recheck and will call earlier for any increasing size or symptom development.   2. Erectile dysfunction He has a vacuum erection device and recommend he place the compression band on after he gets an erection and he may find he will be able to maintain his erection longer.  Intracavernosal injections were also discussed.  We also discussed the availability of an adjustable compression band.    I have reviewed the above documentation for accuracy and completeness, and I agree with the above.   Riki Altes, MD  [facility] [address], [address] [place], [place] [address] [phone]

## 2023-07-02 ENCOUNTER — Other Ambulatory Visit: Payer: Self-pay | Admitting: Physician Assistant

## 2023-07-02 DIAGNOSIS — D329 Benign neoplasm of meninges, unspecified: Secondary | ICD-10-CM

## 2023-07-07 ENCOUNTER — Ambulatory Visit
Admission: RE | Admit: 2023-07-07 | Discharge: 2023-07-07 | Disposition: A | Discharge status: Home / Self Care | Payer: Medicare Other | Source: Ambulatory Visit | Attending: Physician Assistant | Admitting: Physician Assistant

## 2023-07-07 DIAGNOSIS — D329 Benign neoplasm of meninges, unspecified: Secondary | ICD-10-CM | POA: Insufficient documentation

## 2023-07-07 MED ORDER — GADOBUTROL 1 MMOL/ML IV SOLN
8.0000 mL | Freq: Once | INTRAVENOUS | Status: AC | PRN
  Administered 2023-07-07: 8 mL via INTRAVENOUS

## 2023-07-09 ENCOUNTER — Other Ambulatory Visit: Payer: Medicare Other

## 2023-09-13 ENCOUNTER — Emergency Department

## 2023-09-13 ENCOUNTER — Other Ambulatory Visit: Payer: Self-pay

## 2023-09-13 ENCOUNTER — Emergency Department
Admission: EM | Admit: 2023-09-13 | Discharge: 2023-09-14 | Disposition: A | Discharge status: Home / Self Care | Attending: Emergency Medicine | Admitting: Emergency Medicine

## 2023-09-13 DIAGNOSIS — N132 Hydronephrosis with renal and ureteral calculous obstruction: Secondary | ICD-10-CM | POA: Insufficient documentation

## 2023-09-13 DIAGNOSIS — N201 Calculus of ureter: Secondary | ICD-10-CM

## 2023-09-13 DIAGNOSIS — R109 Unspecified abdominal pain: Secondary | ICD-10-CM | POA: Diagnosis present

## 2023-09-13 LAB — CBC WITH DIFFERENTIAL/PLATELET
Abs Immature Granulocytes: 0 10*3/uL (ref 0.00–0.07)
Basophils Absolute: 0 10*3/uL (ref 0.0–0.1)
Basophils Relative: 1 %
Eosinophils Absolute: 0.1 10*3/uL (ref 0.0–0.5)
Eosinophils Relative: 3 %
HCT: 44.2 % (ref 39.0–52.0)
Hemoglobin: 15.1 g/dL (ref 13.0–17.0)
Immature Granulocytes: 0 %
Lymphocytes Relative: 25 %
Lymphs Abs: 1.1 10*3/uL (ref 0.7–4.0)
MCH: 32.1 pg (ref 26.0–34.0)
MCHC: 34.2 g/dL (ref 30.0–36.0)
MCV: 94 fL (ref 80.0–100.0)
Monocytes Absolute: 0.4 10*3/uL (ref 0.1–1.0)
Monocytes Relative: 10 %
Neutro Abs: 2.7 10*3/uL (ref 1.7–7.7)
Neutrophils Relative %: 61 %
Platelets: 155 10*3/uL (ref 150–400)
RBC: 4.7 MIL/uL (ref 4.22–5.81)
RDW: 12.3 % (ref 11.5–15.5)
WBC: 4.3 10*3/uL (ref 4.0–10.5)
nRBC: 0 % (ref 0.0–0.2)

## 2023-09-13 LAB — COMPREHENSIVE METABOLIC PANEL WITH GFR
ALT: 13 U/L (ref 0–44)
AST: 20 U/L (ref 15–41)
Albumin: 3.8 g/dL (ref 3.5–5.0)
Alkaline Phosphatase: 48 U/L (ref 38–126)
Anion gap: 7 (ref 5–15)
BUN: 17 mg/dL (ref 8–23)
CO2: 26 mmol/L (ref 22–32)
Calcium: 9.2 mg/dL (ref 8.9–10.3)
Chloride: 104 mmol/L (ref 98–111)
Creatinine, Ser: 0.92 mg/dL (ref 0.61–1.24)
GFR, Estimated: >60 mL/min (ref >60)
Glucose, Bld: 140 mg/dL — ABNORMAL HIGH (ref 70–99)
Potassium: 3.8 mmol/L (ref 3.5–5.1)
Sodium: 137 mmol/L (ref 135–145)
Total Bilirubin: 1.1 mg/dL (ref 0.0–1.2)
Total Protein: 6.3 g/dL — ABNORMAL LOW (ref 6.5–8.1)

## 2023-09-13 LAB — URINALYSIS, ROUTINE W REFLEX MICROSCOPIC
Bilirubin Urine: NEGATIVE
Glucose, UA: NEGATIVE mg/dL
Hgb urine dipstick: NEGATIVE
Ketones, ur: NEGATIVE mg/dL
Leukocytes,Ua: NEGATIVE
Nitrite: NEGATIVE
Protein, ur: NEGATIVE mg/dL
Specific Gravity, Urine: 1.026 (ref 1.005–1.030)
pH: 5 (ref 5.0–8.0)

## 2023-09-13 MED ORDER — OXYCODONE HCL 5 MG PO TABS
5.0000 mg | ORAL_TABLET | Freq: Once | ORAL | Status: AC
  Administered 2023-09-13: 5 mg via ORAL
  Filled 2023-09-13: qty 1

## 2023-09-13 MED ORDER — ACETAMINOPHEN 500 MG PO TABS
1000.0000 mg | ORAL_TABLET | Freq: Once | ORAL | Status: AC
  Administered 2023-09-13: 1000 mg via ORAL
  Filled 2023-09-13: qty 2

## 2023-09-13 NOTE — ED Provider Notes (Signed)
 [facility] Provider Note    Event Date/Time   First MD Initiated Contact with Patient 09/13/23 2308     (approximate)   History   Flank Pain   HPI  SYMERE MUISE is a 73 y.o. male   Past medical history of kidney stones who presents to the Emergency Department with left flank pain acute onset, intermittent and severe with no radiation of pain.  Started at rest.  No obvious inciting event.  No dysuria or frequency.  No other acute medical complaints.  Independent Historian contributed to assessment above: Wife at bedside corroborates information past medical history as above  Physical Exam   Triage Vital Signs: ED Triage Vitals  Encounter Vitals Group     BP 09/13/23 2204 (!) 161/90     Systolic BP Percentile --      Diastolic BP Percentile --      Pulse Rate 09/13/23 2204 62     Resp 09/13/23 2204 18     Temp 09/13/23 2204 97.7 F (36.5 C)     Temp Source 09/13/23 2204 Oral     SpO2 09/13/23 2204 97 %     Weight --      Height --      Head Circumference --      Peak Flow --      Pain Score 09/13/23 2202 3     Pain Loc --      Pain Education --      Exclude from Growth Chart --     Most recent vital signs: Vitals:   09/13/23 2204 09/14/23 0039  BP: (!) 161/90 (!) 158/92  Pulse: 62 65  Resp: 18 17  Temp: 97.7 F (36.5 C) 97.8 F (36.6 C)  SpO2: 97% 97%    General: Awake, no distress.  CV:  Good peripheral perfusion.  Resp:  Normal effort.  Abd:  No distention.  Other:  Left CVA tenderness, with a soft benign abdominal exam deep palpation all quadrants.  Slightly hypertensive otherwise vital signs are normal.   ED Results / Procedures / Treatments   Labs (all labs ordered are listed, but only abnormal results are displayed) Labs Reviewed  COMPREHENSIVE METABOLIC PANEL WITH GFR - Abnormal; Notable for the following components:      Result Value   Glucose, Bld 140 (*)    Total Protein 6.3 (*)    All other components within  normal limits  URINALYSIS, ROUTINE W REFLEX MICROSCOPIC - Abnormal; Notable for the following components:   Color, Urine YELLOW (*)    APPearance CLEAR (*)    All other components within normal limits  CBC WITH DIFFERENTIAL/PLATELET     I ordered and reviewed the above labs they are notable for white blood cell count is normal.  No bacteria in the urine.  RADIOLOGY I independently reviewed and interpreted CT of the abdomen and see no obvious obstructive inflammatory changes I also reviewed radiologist's formal read.   PROCEDURES:  Critical Care performed: No  Procedures   MEDICATIONS ORDERED IN ED: Medications  acetaminophen (TYLENOL) tablet 1,000 mg (1,000 mg Oral Given 09/13/23 2358)  oxyCODONE (Oxy IR/ROXICODONE) immediate release tablet 5 mg (5 mg Oral Given 09/13/23 2359)    IMPRESSION / MDM / ASSESSMENT AND PLAN / ED COURSE  I reviewed the triage vital signs and the nursing notes.  Patient's presentation is most consistent with acute presentation with potential threat to life or bodily function.  Differential diagnosis includes, but is not limited to, renal colic, ureterolithiasis, obstructive uropathy, intra-abdominal infection like appendicitis, diverticulitis, urinary tract infection   The patient is on the cardiac monitor to evaluate for evidence of arrhythmia and/or significant heart rate changes.  MDM:    He has a small 2 mm UVJ stone on the left side that accounts for his pain.  Pain now is nonexistent.  Management is limited by his allergies to Flomax and NSAIDs.  He is given Tylenol and oxycodone with very good effect.  No concurrent urinary tract infection.  He already has a urologist who is seen several years ago.  He will follow-up with his urologist, also given contact information for our urologist should he not be able to get in touch.  Given a strainer and a specimen cup.  Discharge.       FINAL CLINICAL IMPRESSION(S)  / ED DIAGNOSES   Final diagnoses:  Ureterolithiasis     Rx / DC Orders   ED Discharge Orders          Ordered    oxyCODONE (ROXICODONE) 5 MG immediate release tablet  Every 8 hours PRN        09/14/23 0031             Note:  This document was prepared using Dragon voice recognition software and may include unintentional dictation errors.    Buell Carmin, MD 09/14/23 [phone]

## 2023-09-13 NOTE — ED Triage Notes (Signed)
 L flank pain that started about 9 pm. States he has a Hx of Kidney stones. Denies N/V. No dysuria

## 2023-09-14 MED ORDER — OXYCODONE HCL 5 MG PO TABS: 5.0000 mg | ORAL_TABLET | Freq: Three times a day (TID) | ORAL | 0 refills | Status: DC | PRN

## 2023-09-14 NOTE — Discharge Instructions (Addendum)
 Call your urologist for an appointment.  If you are unable to reconnect with your prior urologist I gave you the name of 2 urologist that we work with you can give a call for an appointment.  Use the strainer to try to catch the stone and speak with your urologist for further testing.  Take Tylenol 650 mg every 6 hours as needed for pain.  Use oxycodone for severe pain only.

## 2023-09-14 NOTE — ED Notes (Signed)
 Pt Aox4 cleared and discharged. All discharge teachings given to pt. Pt verbalizes back understanding of teachings. Departed ER with wife

## 2023-11-13 ENCOUNTER — Ambulatory Visit (INDEPENDENT_AMBULATORY_CARE_PROVIDER_SITE_OTHER)

## 2023-11-13 DIAGNOSIS — K449 Diaphragmatic hernia without obstruction or gangrene: Secondary | ICD-10-CM | POA: Diagnosis not present

## 2023-11-13 DIAGNOSIS — K2270 Barrett's esophagus without dysplasia: Secondary | ICD-10-CM | POA: Diagnosis present

## 2023-12-08 ENCOUNTER — Other Ambulatory Visit: Payer: Self-pay | Admitting: Physician Assistant

## 2023-12-08 DIAGNOSIS — M5416 Radiculopathy, lumbar region: Secondary | ICD-10-CM

## 2023-12-08 DIAGNOSIS — M47816 Spondylosis without myelopathy or radiculopathy, lumbar region: Secondary | ICD-10-CM

## 2023-12-14 ENCOUNTER — Ambulatory Visit
Admission: RE | Admit: 2023-12-14 | Discharge: 2023-12-14 | Disposition: A | Discharge status: Home / Self Care | Source: Ambulatory Visit | Attending: Physician Assistant

## 2023-12-14 DIAGNOSIS — M47816 Spondylosis without myelopathy or radiculopathy, lumbar region: Secondary | ICD-10-CM

## 2023-12-14 DIAGNOSIS — M5416 Radiculopathy, lumbar region: Secondary | ICD-10-CM

## 2023-12-14 MED ORDER — GADOPICLENOL 0.5 MMOL/ML IV SOLN
10.0000 mL | Freq: Once | INTRAVENOUS | Status: AC | PRN
  Administered 2023-12-14: 10 mL via INTRAVENOUS

## 2023-12-17 ENCOUNTER — Encounter: Payer: Self-pay | Admitting: Urology

## 2024-01-04 IMAGING — CT CT HEART MORP W/ CTA COR W/ SCORE W/ CA W/CM &/OR W/O CM
1 of 14 series · 3 of 20 positions shown, 4 images · non-contrast
Comparison: None.

Addendum:
CLINICAL DATA: Chest pain

EXAM:
Cardiac/Coronary  CTA
TECHNIQUE: The patient was scanned on a Siemens Somatom go.Top scanner.

[Series 25: multiphase % cta coronary 0.60 · axial · 0.37mm/px · z∈[-1147,-1084]mm · 3 of 3498 slices shown, 4 images]
[im 875/3498  vessel]
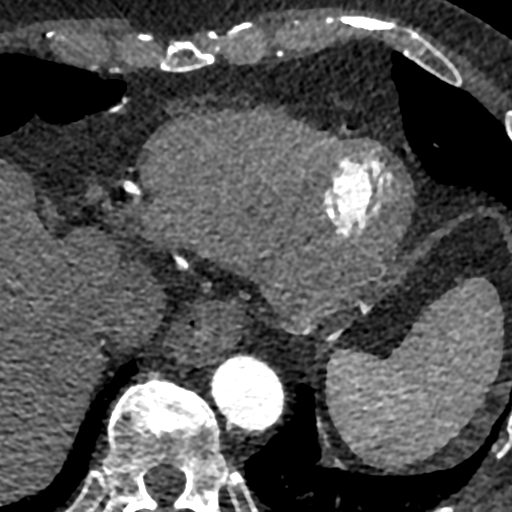
[im 875/3498  lung]
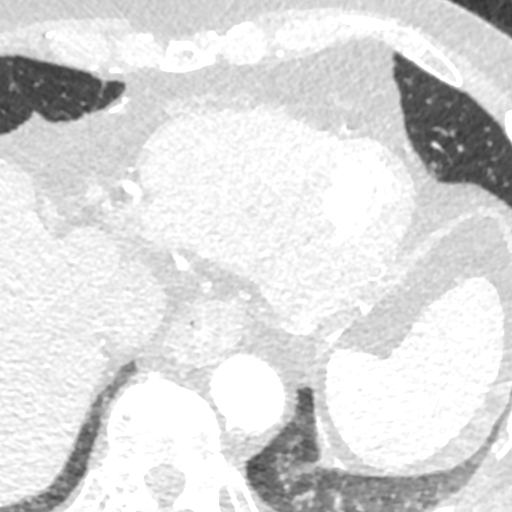
[im 1749/3498  vessel]
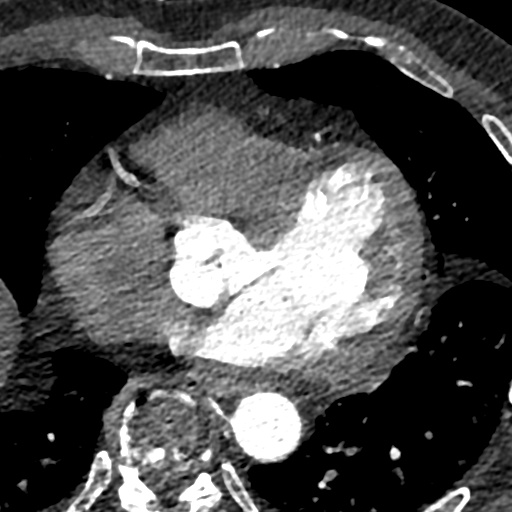
[im 2623/3498  vessel]
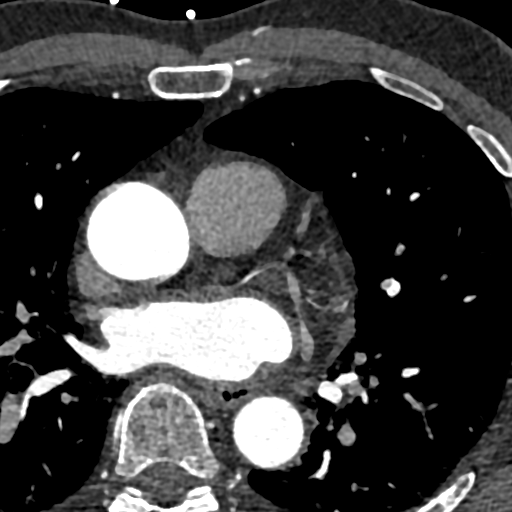

[3 of 20 positions shown; findings below may reference images not displayed]

:
A retrospective scan was triggered in the descending thoracic aorta.
Axial non-contrast 3 mm slices were carried out through the heart.
The data set was analyzed on a dedicated work station and scored
using the Agatson method. Gantry rotation speed was 330 msecs and
collimation was .6 mm. 10mg of metoprolol IV, and 0.8 mg of sl NTG
was given. The 3D data set was reconstructed in 5% intervals of the
60-95 % of the R-R cycle. Diastolic phases were analyzed on a
dedicated work station using MPR, MIP and VRT modes. The patient
received 75 cc of contrast.
FINDINGS: Aorta:  Normal size.  No calcifications.  No dissection.

Aortic Valve:  Trileaflet.  No calcifications.

Coronary Arteries:  Normal coronary origin.  Right dominance.

RCA is a dominant artery that gives rise to PDA and PLA. There is
calcified plaque in the proximal segment causing mild stenosis
(25%).

Left main is a large artery that gives rise to LAD and LCX arteries.
LM has no disease.

LAD has calcified plaque in the proximal to mid segment causing
moderate stenosis (50%).

LCX is a non-dominant artery that gives rise to one OM1 branch.
There is calcified plaque in the mid segment causing mild stenosis
(25%).

Other findings:

Normal pulmonary vein drainage into the left atrium.

Normal left atrial appendage without a thrombus.

Normal size of the pulmonary artery.
IMPRESSION: 1. Coronary calcium score of 352. This was 66th percentile for age
and sex matched control.

2. Normal coronary origin with right dominance.

3. Moderate proximal-mid LAD stenosis (50%).

4. Mild proximal RCA and LCx disease (25%)

5. CAD-RADS 3. Moderate stenosis. Consider symptom-guided
anti-ischemic pharmacotherapy as well as risk factor modification
per guideline directed care. Additional analysis with CT FFR will be
submitted.

EXAM:
OVER-READ INTERPRETATION  CT CHEST

The following report is an over-read performed by radiologist Dr.
does not include interpretation of cardiac or coronary anatomy or
pathology. The coronary CTA interpretation by the cardiologist is
attached.
FINDINGS: Cardiovascular: Normal heart size. No significant pericardial
effusion/thickening. Atherosclerotic nonaneurysmal thoracic aorta.
Normal caliber pulmonary arteries. No central pulmonary emboli.

Mediastinum/Nodes: Unremarkable esophagus. No pathologically
enlarged mediastinal or hilar lymph nodes.

Lungs/Pleura: No pneumothorax. No pleural effusion. Peripheral left
lower lobe 0.2 cm solid pulmonary nodule (series 9/image 24).
Clustered indistinct medial right lower lobe pulmonary nodules,
largest 0.5 cm (series 9/image 7).

Upper abdomen: Small hiatal hernia. Subcentimeter hypodense
posterior upper right liver lesion is too small to characterize.

Musculoskeletal: No aggressive appearing focal osseous lesions. Mild
thoracic spondylosis.
IMPRESSION: 1. Scattered pulmonary nodules, largest 0.5 cm in the medial right
lower lobe. No follow-up needed if patient is low-risk (and has no
known or suspected primary neoplasm). Non-contrast chest CT can be
considered in 12 months if patient is high-risk. This recommendation
follows the consensus statement: Guidelines for Management of
Incidental Pulmonary Nodules Detected on CT Images: From the
2. Small hiatal hernia.
3. Aortic Atherosclerosis ([id number]).

*** End of Addendum ***
:
A retrospective scan was triggered in the descending thoracic aorta.
Axial non-contrast 3 mm slices were carried out through the heart.
The data set was analyzed on a dedicated work station and scored
using the Agatson method. Gantry rotation speed was 330 msecs and
collimation was .6 mm. 10mg of metoprolol IV, and 0.8 mg of sl NTG
was given. The 3D data set was reconstructed in 5% intervals of the
60-95 % of the R-R cycle. Diastolic phases were analyzed on a
dedicated work station using MPR, MIP and VRT modes. The patient
received 75 cc of contrast.
FINDINGS: Aorta:  Normal size.  No calcifications.  No dissection.

Aortic Valve:  Trileaflet.  No calcifications.

Coronary Arteries:  Normal coronary origin.  Right dominance.

RCA is a dominant artery that gives rise to PDA and PLA. There is
calcified plaque in the proximal segment causing mild stenosis
(25%).

Left main is a large artery that gives rise to LAD and LCX arteries.
LM has no disease.

LAD has calcified plaque in the proximal to mid segment causing
moderate stenosis (50%).

LCX is a non-dominant artery that gives rise to one OM1 branch.
There is calcified plaque in the mid segment causing mild stenosis
(25%).

Other findings:

Normal pulmonary vein drainage into the left atrium.

Normal left atrial appendage without a thrombus.

Normal size of the pulmonary artery.
IMPRESSION: 1. Coronary calcium score of 352. This was 66th percentile for age
and sex matched control.

2. Normal coronary origin with right dominance.

3. Moderate proximal-mid LAD stenosis (50%).

4. Mild proximal RCA and LCx disease (25%)

5. CAD-RADS 3. Moderate stenosis. Consider symptom-guided
anti-ischemic pharmacotherapy as well as risk factor modification
per guideline directed care. Additional analysis with CT FFR will be
submitted.

## 2024-01-12 ENCOUNTER — Other Ambulatory Visit: Payer: Self-pay | Admitting: Orthopedic Surgery

## 2024-01-25 ENCOUNTER — Other Ambulatory Visit: Payer: Self-pay

## 2024-01-25 ENCOUNTER — Encounter
Admission: RE | Admit: 2024-01-25 | Discharge: 2024-01-25 | Disposition: A | Discharge status: Home / Self Care | Source: Ambulatory Visit | Attending: Orthopedic Surgery | Admitting: Orthopedic Surgery

## 2024-01-25 VITALS — BP 146/86 | HR 59 | Temp 98.0°F | Resp 18 | Ht 72.0 in | Wt 173.2 lb

## 2024-01-25 DIAGNOSIS — Z01818 Encounter for other preprocedural examination: Secondary | ICD-10-CM | POA: Insufficient documentation

## 2024-01-25 DIAGNOSIS — Z01812 Encounter for preprocedural laboratory examination: Secondary | ICD-10-CM

## 2024-01-25 HISTORY — DX: Personal history of other diseases of the digestive system: Z87.19

## 2024-01-25 HISTORY — DX: Essential (primary) hypertension: I10

## 2024-01-25 HISTORY — DX: Gastro-esophageal reflux disease without esophagitis: K21.9

## 2024-01-25 HISTORY — DX: Unilateral primary osteoarthritis, left knee: M17.12

## 2024-01-25 HISTORY — DX: Anesthesia of skin: R20.0

## 2024-01-25 HISTORY — DX: Personal history of adenomatous and serrated colon polyps: Z86.0101

## 2024-01-25 HISTORY — DX: Spondylosis without myelopathy or radiculopathy, lumbar region: M47.816

## 2024-01-25 HISTORY — DX: Diaphragmatic hernia without obstruction or gangrene: K44.9

## 2024-01-25 HISTORY — DX: Mixed hyperlipidemia: E78.2

## 2024-01-25 HISTORY — DX: Benign neoplasm of meninges, unspecified: D32.9

## 2024-01-25 HISTORY — DX: Malignant (primary) neoplasm, unspecified: C80.1

## 2024-01-25 HISTORY — DX: Raynaud's syndrome without gangrene: I73.00

## 2024-01-25 HISTORY — DX: Unspecified glaucoma: H40.9

## 2024-01-25 HISTORY — DX: Personal history of malignant neoplasm of prostate: Z85.46

## 2024-01-25 HISTORY — DX: Localized edema: R60.0

## 2024-01-25 HISTORY — DX: Barrett's esophagus without dysplasia: K22.70

## 2024-01-25 HISTORY — DX: Other symptoms and signs involving cognitive functions and awareness: R41.89

## 2024-01-25 HISTORY — DX: Dizziness and giddiness: R42

## 2024-01-25 HISTORY — DX: Bradycardia, unspecified: R00.1

## 2024-01-25 HISTORY — DX: Obstructive sleep apnea (adult) (pediatric): G47.33

## 2024-01-25 HISTORY — DX: Ulcer of esophagus without bleeding: K22.10

## 2024-01-25 HISTORY — DX: Other forms of dyspnea: R06.09

## 2024-01-25 HISTORY — DX: Personal history of malignant neoplasm of other organs and systems: Z85.89

## 2024-01-25 LAB — CBC WITH DIFFERENTIAL/PLATELET
Abs Immature Granulocytes: 0.01 K/uL (ref 0.00–0.07)
Basophils Absolute: 0 K/uL (ref 0.0–0.1)
Basophils Relative: 0 %
Eosinophils Absolute: 0.1 K/uL (ref 0.0–0.5)
Eosinophils Relative: 1 %
HCT: 45.6 % (ref 39.0–52.0)
Hemoglobin: 15.8 g/dL (ref 13.0–17.0)
Immature Granulocytes: 0 %
Lymphocytes Relative: 20 %
Lymphs Abs: 1 K/uL (ref 0.7–4.0)
MCH: 32 pg (ref 26.0–34.0)
MCHC: 34.6 g/dL (ref 30.0–36.0)
MCV: 92.5 fL (ref 80.0–100.0)
Monocytes Absolute: 0.3 K/uL (ref 0.1–1.0)
Monocytes Relative: 6 %
Neutro Abs: 3.5 K/uL (ref 1.7–7.7)
Neutrophils Relative %: 73 %
Platelets: 154 K/uL (ref 150–400)
RBC: 4.93 MIL/uL (ref 4.22–5.81)
RDW: 12.2 % (ref 11.5–15.5)
WBC: 4.8 K/uL (ref 4.0–10.5)
nRBC: 0 % (ref 0.0–0.2)

## 2024-01-25 LAB — URINALYSIS, ROUTINE W REFLEX MICROSCOPIC
Bilirubin Urine: NEGATIVE
Glucose, UA: NEGATIVE mg/dL
Hgb urine dipstick: NEGATIVE
Ketones, ur: NEGATIVE mg/dL
Leukocytes,Ua: NEGATIVE
Nitrite: NEGATIVE
Protein, ur: NEGATIVE mg/dL
Specific Gravity, Urine: 1.012 (ref 1.005–1.030)
pH: 7 (ref 5.0–8.0)

## 2024-01-25 LAB — COMPREHENSIVE METABOLIC PANEL WITH GFR
ALT: 14 U/L (ref 0–44)
AST: 17 U/L (ref 15–41)
Albumin: 4 g/dL (ref 3.5–5.0)
Alkaline Phosphatase: 58 U/L (ref 38–126)
Anion gap: 8 (ref 5–15)
BUN: 12 mg/dL (ref 8–23)
CO2: 24 mmol/L (ref 22–32)
Calcium: 7.4 mg/dL — ABNORMAL LOW (ref 8.9–10.3)
Chloride: 81 mmol/L — ABNORMAL LOW (ref 98–111)
Creatinine, Ser: 0.82 mg/dL (ref 0.61–1.24)
GFR, Estimated: >60 mL/min (ref >60)
Glucose, Bld: 85 mg/dL (ref 70–99)
Potassium: 4.6 mmol/L (ref 3.5–5.1)
Sodium: 127 mmol/L — ABNORMAL LOW (ref 135–145)
Total Bilirubin: 1.5 mg/dL — ABNORMAL HIGH (ref 0.0–1.2)
Total Protein: 6.6 g/dL (ref 6.5–8.1)

## 2024-01-25 LAB — SURGICAL PCR SCREEN
MRSA, PCR: NEGATIVE
Staphylococcus aureus: NEGATIVE

## 2024-01-25 NOTE — Patient Instructions (Addendum)
 Your procedure is scheduled nw:Fnwijb 02/01/24 Report to the Registration Desk on the 1st floor of the [facility]. To find out your arrival time, please call [phone] between 1PM - 3PM on: Friday 01/29/24 If your arrival time is 6:00 am, do not arrive before that time as the [facility] entrance doors do not open until 6:00 am.  REMEMBER: Instructions that are not followed completely may result in serious medical risk, up to and including death; or upon the discretion of your surgeon and anesthesiologist your surgery may need to be rescheduled.  Do not eat food after midnight the night before surgery.  No gum chewing or hard candies.  You may however, drink CLEAR liquids up to 2 hours before you are scheduled to arrive for your surgery. Do not drink anything within 2 hours of your scheduled arrival time.  Clear liquids include: - water  - apple juice without pulp - gatorade (not RED colors) - black coffee or tea (Do NOT add milk or creamers to the coffee or tea) Do NOT drink anything that is not on this list.   In addition, your doctor has ordered for you to drink the provided:  Ensure Pre-Surgery Clear Carbohydrate Drink  Drinking this carbohydrate drink up to two hours before surgery helps to reduce insulin resistance and improve patient outcomes. Please complete drinking 2 hours before scheduled arrival time.  One week prior to surgery: Stop Anti-inflammatories (NSAIDS) such as Advil, Aleve, Ibuprofen, Motrin, Naproxen, Naprosyn and Aspirin based products such as Excedrin, Goody's Powder, BC Powder. Stop ANY OVER THE COUNTER supplements until after surgery. cyanocobalamin (VITAMIN B12) 1000 MCG  cholecalciferol (VITAMIN D3) 25 MCG (1000 UNIT)   You may however, continue to take Tylenol  if needed for pain up until the day of surgery.  Stop sildenafil (REVATIO) 20 MG 2 days prior to surgery. (Do not take after Friday 01/29/24)  Continue taking all of your other prescription  medications up until the day of surgery.  ON THE DAY OF SURGERY ONLY TAKE THESE MEDICATIONS WITH SIPS OF WATER:  amLODipine (NORVASC) 5 MG  memantine (NAMENDA) 2.5 MG  pantoprazole (PROTONIX) 40 MG    No Alcohol for 24 hours before or after surgery.  No Smoking including e-cigarettes for 24 hours before surgery.  No chewable tobacco products for at least 6 hours before surgery.  No nicotine patches on the day of surgery.  Do not use any recreational drugs for at least a week (preferably 2 weeks) before your surgery.  Please be advised that the combination of cocaine and anesthesia may have negative outcomes, up to and including death. If you test positive for cocaine, your surgery will be cancelled.  On the morning of surgery brush your teeth with toothpaste and water, you may rinse your mouth with mouthwash if you wish. Do not swallow any toothpaste or mouthwash.  Use CHG Soap or wipes as directed on instruction sheet.  Do not wear jewelry, make-up, hairpins, clips or nail polish.  For welded (permanent) jewelry: bracelets, anklets, waist bands, etc.  Please have this removed prior to surgery.  If it is not removed, there is a chance that hospital personnel will need to cut it off on the day of surgery.  Do not wear lotions, powders, or perfumes.   Do not shave body hair from the neck down 48 hours before surgery.  Contact lenses, hearing aids and dentures may not be worn into surgery.  Do not bring valuables to the hospital. [facility] is not responsible for any missing/lost belongings or valuables.   Bring your C-PAP to the hospital in case you may have to spend the night.   Notify your doctor if there is any change in your medical condition (cold, fever, infection).  Wear comfortable clothing (specific to your surgery type) to the hospital.  After surgery, you can help prevent lung complications by doing breathing exercises.  Take deep breaths and cough every 1-2  hours. Your doctor may order a device called an Incentive Spirometer to help you take deep breaths. When coughing or sneezing, hold a pillow firmly against your incision with both hands. This is called "splinting." Doing this helps protect your incision. It also decreases belly discomfort.  If you are being admitted to the hospital overnight, leave your suitcase in the car. After surgery it may be brought to your room.  In case of increased patient census, it may be necessary for you, the patient, to continue your postoperative care in the Same Day Surgery department.  If you are being discharged the day of surgery, you will not be allowed to drive home. You will need a responsible individual to drive you home and stay with you for 24 hours after surgery.   If you are taking public transportation, you will need to have a responsible individual with you.  Please call the Pre-admissions Testing Dept. at [phone] if you have any questions about these instructions.  Surgery Visitation Policy:  Patients having surgery or a procedure may have two visitors.  Children under the age of 72 must have an adult with them who is not the patient.  Inpatient Visitation:    Visiting hours are 7 a.m. to 8 p.m. Up to four visitors are allowed at one time in a patient room. The visitors may rotate out with other people during the day.  One visitor age 63 or older may stay with the patient overnight and must be in the room by 8 p.m.   Merchandiser, retail to address health-related social needs:  [email or web address]    Pre-operative 5 CHG Bath Instructions   You can play a key role in reducing the risk of infection after surgery. Your skin needs to be as free of germs as possible. You can reduce the number of germs on your skin by washing with CHG (chlorhexidine  gluconate) soap before surgery. CHG is an antiseptic soap that kills germs and continues to kill germs even after  washing.   DO NOT use if you have an allergy to chlorhexidine /CHG or antibacterial soaps. If your skin becomes reddened or irritated, stop using the CHG and notify one of our RNs at [phone].   Please shower with the CHG soap starting 4 days before surgery using the following schedule:     Please keep in mind the following:  DO NOT shave, including legs and underarms, starting the day of your first shower.   You may shave your face at any point before/day of surgery.  Place clean sheets on your bed the day you start using CHG soap. Use a clean washcloth (not used since being washed) for each shower. DO NOT sleep with pets once you start using the CHG.   CHG Shower Instructions:  If you choose to wash your hair and private area, wash first with your normal shampoo/soap.  After you use shampoo/soap, rinse your hair and body thoroughly to remove shampoo/soap residue.  Turn the water OFF and apply about 3 tablespoons (45 ml) of  CHG soap to a CLEAN washcloth.  Apply CHG soap ONLY FROM YOUR NECK DOWN TO YOUR TOES (washing for 3-5 minutes)  DO NOT use CHG soap on face, private areas, open wounds, or sores.  Pay special attention to the area where your surgery is being performed.  If you are having back surgery, having someone wash your back for you may be helpful. Wait 2 minutes after CHG soap is applied, then you may rinse off the CHG soap.  Pat dry with a clean towel  Put on clean clothes/pajamas   If you choose to wear lotion, please use ONLY the CHG-compatible lotions on the back of this paper.     Additional instructions for the day of surgery: DO NOT APPLY any lotions, deodorants, cologne, or perfumes.   Put on clean/comfortable clothes.  Brush your teeth.  Ask your nurse before applying any prescription medications to the skin.      CHG Compatible Lotions   Aveeno Moisturizing lotion  Cetaphil Moisturizing Cream  Cetaphil Moisturizing Lotion  Clairol Herbal Essence  Moisturizing Lotion, Dry Skin  Clairol Herbal Essence Moisturizing Lotion, Extra Dry Skin  Clairol Herbal Essence Moisturizing Lotion, Normal Skin  Curel Age Defying Therapeutic Moisturizing Lotion with Alpha Hydroxy  Curel Extreme Care Body Lotion  Curel Soothing Hands Moisturizing Hand Lotion  Curel Therapeutic Moisturizing Cream, Fragrance-Free  Curel Therapeutic Moisturizing Lotion, Fragrance-Free  Curel Therapeutic Moisturizing Lotion, Original Formula  Eucerin Daily Replenishing Lotion  Eucerin Dry Skin Therapy Plus Alpha Hydroxy Crme  Eucerin Dry Skin Therapy Plus Alpha Hydroxy Lotion  Eucerin Original Crme  Eucerin Original Lotion  Eucerin Plus Crme Eucerin Plus Lotion  Eucerin TriLipid Replenishing Lotion  Keri Anti-Bacterial Hand Lotion  Keri Deep Conditioning Original Lotion Dry Skin Formula Softly Scented  Keri Deep Conditioning Original Lotion, Fragrance Free Sensitive Skin Formula  Keri Lotion Fast Absorbing Fragrance Free Sensitive Skin Formula  Keri Lotion Fast Absorbing Softly Scented Dry Skin Formula  Keri Original Lotion  Keri Skin Renewal Lotion Keri Silky Smooth Lotion  Keri Silky Smooth Sensitive Skin Lotion  Nivea Body Creamy Conditioning Oil  Nivea Body Extra Enriched Lotion  Nivea Body Original Lotion  Nivea Body Sheer Moisturizing Lotion Nivea Crme  Nivea Skin Firming Lotion  NutraDerm 30 Skin Lotion  NutraDerm Skin Lotion  NutraDerm Therapeutic Skin Cream  NutraDerm Therapeutic Skin Lotion  ProShield Protective Hand Cream  Provon moisturizing lotion  How to Use an Incentive Spirometer  An incentive spirometer is a tool that measures how well you are filling your lungs with each breath. Learning to take long, deep breaths using this tool can help you keep your lungs clear and active. This may help to reverse or lessen your chance of developing breathing (pulmonary) problems, especially infection. You may be asked to use a spirometer: After a  surgery. If you have a lung problem or a history of smoking. After a long period of time when you have been unable to move or be active. If the spirometer includes an indicator to show the highest number that you have reached, your health care provider or respiratory therapist will help you set a goal. Keep a log of your progress as told by your health care provider. What are the risks? Breathing too quickly may cause dizziness or cause you to pass out. Take your time so you do not get dizzy or light-headed. If you are in pain, you may need to take pain medicine before doing incentive spirometry. It is harder to  take a deep breath if you are having pain. How to use your incentive spirometer  Sit up on the edge of your bed or on a chair. Hold the incentive spirometer so that it is in an upright position. Before you use the spirometer, breathe out normally. Place the mouthpiece in your mouth. Make sure your lips are closed tightly around it. Breathe in slowly and as deeply as you can through your mouth, causing the piston or the ball to rise toward the top of the chamber. Hold your breath for 3-5 seconds, or for as long as possible. If the spirometer includes a coach indicator, use this to guide you in breathing. Slow down your breathing if the indicator goes above the marked areas. Remove the mouthpiece from your mouth and breathe out normally. The piston or ball will return to the bottom of the chamber. Rest for a few seconds, then repeat the steps 10 or more times. Take your time and take a few normal breaths between deep breaths so that you do not get dizzy or light-headed. Do this every 1-2 hours when you are awake. If the spirometer includes a goal marker to show the highest number you have reached (best effort), use this as a goal to work toward during each repetition. After each set of 10 deep breaths, cough a few times. This will help to make sure that your lungs are clear. If you have  an incision on your chest or abdomen from surgery, place a pillow or a rolled-up towel firmly against the incision when you cough. This can help to reduce pain while taking deep breaths and coughing. General tips When you are able to get out of bed: Walk around often. Continue to take deep breaths and cough in order to clear your lungs. Keep using the incentive spirometer until your health care provider says it is okay to stop using it. If you have been in the hospital, you may be told to keep using the spirometer at home. Contact a health care provider if: You are having difficulty using the spirometer. You have trouble using the spirometer as often as instructed. Your pain medicine is not giving enough relief for you to use the spirometer as told. You have a fever. Get help right away if: You develop shortness of breath. You develop a cough with bloody mucus from the lungs. You have fluid or blood coming from an incision site after you cough. Summary An incentive spirometer is a tool that can help you learn to take long, deep breaths to keep your lungs clear and active. You may be asked to use a spirometer after a surgery, if you have a lung problem or a history of smoking, or if you have been inactive for a long period of time. Use your incentive spirometer as instructed every 1-2 hours while you are awake. If you have an incision on your chest or abdomen, place a pillow or a rolled-up towel firmly against your incision when you cough. This will help to reduce pain. Get help right away if you have shortness of breath, you cough up bloody mucus, or blood comes from your incision when you cough. This information is not intended to replace advice given to you by your health care provider. Make sure you discuss any questions you have with your health care provider.   Please go to the following website to access important education materials concerning your upcoming joint replacement.  [email or web address]

## 2024-01-29 ENCOUNTER — Encounter: Payer: Self-pay | Admitting: Orthopedic Surgery

## 2024-01-29 NOTE — Progress Notes (Signed)
 Perioperative / Anesthesia Services  Pre-Admission Testing Clinical Review / Pre-Operative Anesthesia Consult  Date: 01/29/24  PATIENT DEMOGRAPHICS: Name: Vincent Patton DOB: 02/09/1951 MRN:   [id number]  Note: Available PAT nursing documentation and vital signs have been reviewed. Clinical nursing staff has updated patient's PMH/PSHx, current medication list, and drug allergies/intolerances to ensure complete and comprehensive history available to assist care teams in MDM as it pertains to the aforementioned surgical procedure and anticipated anesthetic course. Extensive review of available clinical information personally performed. Vincent Patton PMH and PSHx updated with any diagnoses/procedures that  may have been inadvertently omitted during his intake with the pre-admission testing department's nursing staff.  PLANNED SURGICAL PROCEDURE(S):   Case: [id number] Date/Time: 02/01/24 1005   Procedure: ARTHROPLASTY, KNEE, TOTAL (Left: Knee)   Anesthesia type: Choice   Diagnosis: Primary osteoarthritis of left knee [M17.12]   Pre-op diagnosis: Primary osteoarthritis of left knee M17.12   Location: [facility] OR ROOM 02 / [facility]   Surgeons: Lorelle Hussar, MD        CLINICAL DISCUSSION: Vincent Patton is a 73 y.o. male who is submitted for pre-surgical anesthesia review and clearance prior to him undergoing the above procedure. Patient is a Former Games developer. Pertinent PMH includes: CAD, diastolic dysfunction, bradycardia, chronic cerebral microvascular disease, aortic atherosclerosis, HTN, HLD, pulmonary hypertension, DOE, COPD, OSAH (requires nocturnal PAP therapy), GERD (on daily PPI), erosive esophagitis, hiatal hernia, Barrett's esophagus, meningioma, glaucoma, nephrolithiasis, peripheral edema, OA, lumbar spondylosis (s/p L4-L5 RIGHT hemilaminectomy), cognitive impairment.  Patient is followed by cardiology Andrea, MD). He was last seen in the cardiology clinic on  12/16/2023; notes reviewed. At the time of his clinic visit, planes of a 64-month history of intermittent exertional chest tightness that was brief in nature and that resolved with rest.  Patient denied any associated shortness of breath, PND, orthopnea, palpitations, significant peripheral edema, weakness, fatigue, vertiginous symptoms, or presyncope/syncope. Patient with a past medical history significant for cardiovascular diagnoses. Documented physical exam was grossly benign, providing no evidence of acute exacerbation and/or decompensation of the patient's known cardiovascular conditions.  Coronary CTA was performed on 08/01/2021 that demonstrated an Agatston coronary artery calcium score of 352. This placed patient in the 66th percentile for age, sex, and race matched controls. Calcium depositions noted to be isolated mainly in the proximal-mid LAD (50%) and proximal RCA/LCx (25%) distributions.  Study demonstrated normal coronary origin with RIGHT dominance.  Study was sent for FFR CT analysis (ranges: < 0.75 high likelihood of hemodynamically significant stenosis, 0.76-0.80 borderline, > 0.80 normal):  Left Main:  No significant stenosis. LAD: No significant stenosis.  FFRct 0.87 LCX: No significant stenosis.  FFRct 0.90 RCA: No significant stenosis.  FFRct 0.86  Myocardial perfusion imaging study performed on 06/03/2023 revealed a normal left ventricular systolic function with an EF of 62%.  There was no evidence of stress-induced myocardial ischemia or arrhythmia; no scintigraphic evidence of scar. TID ratio = 0.95 (normal range </= 1.2).  Study was determined to be normal and low risk.  Most recent TTE performed on 12/23/2023 revealed a normal left ventricular systolic function with an EF of >55 %. There was mild LVH.  There were no regional wall motion abnormalities. Left ventricular diastolic Doppler parameters consistent with pseudonormalization (G2DD). Right ventricular size and function  normal with a TAPSE measuring 2.5 cm  (normal range >/= 1.6 cm).  RVSP = 35.0 mmHg consistent with mild pulmonary hypertension.  There was mild pan valvular regurgitation. All transvalvular gradients  were noted to be normal providing no evidence of hemodynamically significant valvular stenosis. Aorta normal in size with no evidence of ectasia or aneurysmal dilatation.  Blood pressure well controlled at 126/80 mmHg on currently prescribed CCB (amlodipine ) monotherapy.  He is not taking any type of lipid-lowering therapies for his HLD diagnosis and ASCVD prevention. In the setting of known cardiovascular diagnoses, it is important note that patient is on a PDE5i medication (sildenafil) for an erectile dysfunction diagnosis.  Patient is not diabetic.  He does have an OSAH diagnosis and is reported to be compliant with prescribed nocturnal PAP therapy. Patient is able to complete all of his  ADL/IADLs without cardiovascular limitation.  Per the DASI, patient is able to achieve at least 4 METS of physical activity without experiencing any significant degree of angina/anginal equivalent symptoms. No changes were made to his medication regimen during his visit with cardiology.  Patient scheduled to follow-up with outpatient cardiology in 6 months or sooner if needed.  Vincent Patton is scheduled for an elective ARTHROPLASTY, KNEE, TOTAL (Left: Knee) on 02/01/2024 with Dr. Arthea Sheer, MD. Given patient's past medical history significant for cardiovascular diagnoses, presurgical cardiac clearance was sought by the PAT team. Per cardiology, this patient is optimized for surgery and may proceed with the planned procedural course with a LOW risk of significant perioperative cardiovascular complications.  In review of the patient's chart, it is noted that he is on daily oral antithrombotic therapy. Given that patient's past medical history is significant for cardiovascular diagnoses, including but not limited to CAD,  orthopedics has cleared patient to continue his daily low dose ASA throughout his perioperative course.  Patient has been updated on these directives from his specialty care providers by the PAT team.  Patient denies previous perioperative complications with anesthesia in the past. In review his EMR, it is noted that patient underwent a general anesthetic course at [facility] (ASA II) in 11/2018 without documented complications.   MOST RECENT VITAL SIGNS:    01/25/2024    8:57 AM 09/14/2023   12:39 AM 09/13/2023   10:04 PM  Vitals with BMI  Height 6' 0    Weight 173 lbs 3 oz    BMI 23.49    Systolic 146 158 838  Diastolic 86 92 90  Pulse 59 65 62   PROVIDERS/SPECIALISTS: NOTE: Primary physician provider listed below. Patient may have been seen by APP or partner within same practice.   PROVIDER ROLE / SPECIALTY LAST SHERLEAN Sheer Arthea, MD Orthopedics (Surgeon) 01/19/2024  Glover Lenis, MD Primary Care Provider 11/30/2023  Ammon Blunt, MD Cardiology 12/16/2023   Dodson Nest, MD Physiatry 01/05/2024  Shock, Jannett, MD Neurology 10/06/2023   ALLERGIES: Allergies  Allergen Reactions   Lodine [Etodolac] Swelling    He says tongue swelled up   Tamsulosin Other (See Comments) and Shortness Of Breath    Feels like he will pass out while on it with SOB    CURRENT HOME MEDICATIONS: No current facility-administered medications for this encounter.    amLODipine  (NORVASC ) 5 MG tablet   cholecalciferol (VITAMIN D3) 25 MCG (1000 UNIT) tablet   cyanocobalamin (VITAMIN B12) 1000 MCG tablet   memantine  (NAMENDA ) 5 MG tablet   pantoprazole  (PROTONIX ) 40 MG tablet   Potassium 99 MG TABS   timolol  (TIMOPTIC ) 0.5 % ophthalmic solution   aspirin  81 MG chewable tablet   oxyCODONE  (ROXICODONE ) 5 MG immediate release tablet   sildenafil (REVATIO) 20 MG tablet   HISTORY: Past  Medical History:  Diagnosis Date   Actinic keratosis    Aortic atherosclerosis (HCC)     Barrett's esophagus without dysplasia    Bilateral hydrocele    Bilateral leg numbness    Bradycardia    CAD (coronary artery disease)    Cerebral microvascular disease    Cognitive impairment    a.) on NMDA receptor antagonist (memantine )   Diastolic dysfunction    Dizziness    DOE (dyspnea on exertion)    Erectile dysfunction    a.) on PDE5i (sildenafil)   Erosive esophagitis    Genital herpes    GERD (gastroesophageal reflux disease)    Glaucoma    H/O bilateral inguinal hernia repair    Hepatic cyst    Hiatal hernia    History of prostate cancer s/p prostatectomy 2010   Hx of adenomatous colonic polyps    Hyperlipidemia, mixed    Hypertension, essential    Long-term use of aspirin  therapy    Lumbar facet arthropathy    Lumbar spondylosis    a.) s/p RIGHT far lateral metrx hemilaminectomy and discectomy (L4-L5) 11/24/2018   Meningioma (HCC)    Multiple pulmonary nodules determined by computed tomography of lung    Nephrolithiasis    OSA on CPAP    Pedal edema    Primary osteoarthritis of left knee    Pulmonary hypertension (HCC)    Raynaud phenomenon    Renal cyst, left    Squamous cell skin cancer    Past Surgical History:  Procedure Laterality Date   CARPAL TUNNEL RELEASE Right    ESOPHAGOGASTRODUODENOSCOPY     INGUINAL HERNIA REPAIR Bilateral    knee arthoscopy  Right 1983   torn cartlage   KNEE ARTHROSCOPY Right 1979   torn cartlage   leg skin lesion biopsy / lesion  10/2004   squamous cell carcinoma   PROSTATECTOMY  06/2008   RIGHT FAR LATERAL METRX HEMILAMINECTOMY AND DISCECTOMY Right 11/24/2018   Procedure: RIGHT FAR LATERAL METRX HEMILAMINECTOMY AND DISCECTOMY; Location: [facility]   tear ducts surgery      as a child   TONSILLECTOMY  1960   No family history on file. Social History   Tobacco Use   Smoking status: Former    Types: Cigarettes   Smokeless tobacco: Never  Substance Use Topics   Alcohol use: Yes    Comment: beer once a  month   LABS:  Hospital Outpatient Visit on 01/25/2024  Component Date Value Ref Range Status   MRSA, PCR 01/25/2024 NEGATIVE  NEGATIVE Final   Staphylococcus aureus 01/25/2024 NEGATIVE  NEGATIVE Final   Comment: (NOTE) The Xpert SA Assay (FDA approved for NASAL specimens in patients 6 years of age and older), is one component of a comprehensive surveillance program. It is not intended to diagnose infection nor to guide or monitor treatment. Performed at [facility], [address] [place], [place] [address]    WBC 01/25/2024 4.8  4.0 - 10.5 K/uL Final   RBC 01/25/2024 4.93  4.22 - 5.81 MIL/uL Final   Hemoglobin 01/25/2024 15.8  13.0 - 17.0 g/dL Final   HCT 90/91/7974 45.6  39.0 - 52.0 % Final   MCV 01/25/2024 92.5  80.0 - 100.0 fL Final   MCH 01/25/2024 32.0  26.0 - 34.0 pg Final   MCHC 01/25/2024 34.6  30.0 - 36.0 g/dL Final   RDW 90/91/7974 12.2  11.5 - 15.5 % Final   Platelets 01/25/2024 154  150 - 400 K/uL Final  nRBC 01/25/2024 0.0  0.0 - 0.2 % Final   Neutrophils Relative % 01/25/2024 73  % Final   Neutro Abs 01/25/2024 3.5  1.7 - 7.7 K/uL Final   Lymphocytes Relative 01/25/2024 20  % Final   Lymphs Abs 01/25/2024 1.0  0.7 - 4.0 K/uL Final   Monocytes Relative 01/25/2024 6  % Final   Monocytes Absolute 01/25/2024 0.3  0.1 - 1.0 K/uL Final   Eosinophils Relative 01/25/2024 1  % Final   Eosinophils Absolute 01/25/2024 0.1  0.0 - 0.5 K/uL Final   Basophils Relative 01/25/2024 0  % Final   Basophils Absolute 01/25/2024 0.0  0.0 - 0.1 K/uL Final   Immature Granulocytes 01/25/2024 0  % Final   Abs Immature Granulocytes 01/25/2024 0.01  0.00 - 0.07 K/uL Final   Performed at [facility], [address] [place], [place] [address]   Sodium 01/25/2024 127 (L)  135 - 145 mmol/L Corrected   Potassium 01/25/2024 4.6  3.5 - 5.1 mmol/L Final   Chloride 01/25/2024 81 (L)  98 - 111 mmol/L Final   CO2 01/25/2024 24  22 - 32 mmol/L Final   Glucose, Bld  01/25/2024 85  70 - 99 mg/dL Final   Glucose reference range applies only to samples taken after fasting for at least 8 hours.   BUN 01/25/2024 12  8 - 23 mg/dL Final   Creatinine, Ser 01/25/2024 0.82  0.61 - 1.24 mg/dL Final   Calcium 90/91/7974 7.4 (L)  8.9 - 10.3 mg/dL Final   Total Protein 90/91/7974 6.6  6.5 - 8.1 g/dL Final   Albumin 90/91/7974 4.0  3.5 - 5.0 g/dL Final   AST 90/91/7974 17  15 - 41 U/L Final   ALT 01/25/2024 14  0 - 44 U/L Final   Alkaline Phosphatase 01/25/2024 58  38 - 126 U/L Final   Total Bilirubin 01/25/2024 1.5 (H)  0.0 - 1.2 mg/dL Final   GFR, Estimated 01/25/2024 >60  >60 mL/min Final   Comment: (NOTE) Calculated using the CKD-EPI Creatinine Equation (2021)    Anion gap 01/25/2024 8  5 - 15 Final   Performed at [facility], [address] [place], [place] [address]   Color, Urine 01/25/2024 YELLOW (A)  YELLOW Final   APPearance 01/25/2024 CLEAR (A)  CLEAR Final   Specific Gravity, Urine 01/25/2024 1.012  1.005 - 1.030 Final   pH 01/25/2024 7.0  5.0 - 8.0 Final   Glucose, UA 01/25/2024 NEGATIVE  NEGATIVE mg/dL Final   Hgb urine dipstick 01/25/2024 NEGATIVE  NEGATIVE Final   Bilirubin Urine 01/25/2024 NEGATIVE  NEGATIVE Final   Ketones, ur 01/25/2024 NEGATIVE  NEGATIVE mg/dL Final   Protein, ur 90/91/7974 NEGATIVE  NEGATIVE mg/dL Final   Nitrite 90/91/7974 NEGATIVE  NEGATIVE Final   Leukocytes,Ua 01/25/2024 NEGATIVE  NEGATIVE Final   Performed at [facility], [address] [place], [place] [address]    ECG: Date: 12/16/2023  Time ECG obtained: 1543 PM Rate: 54 bpm Rhythm: Sinus bradycardia Axis (leads I and aVF): normal Intervals: PR 200 ms. QRS 106 ms. QTc 403 ms. ST segment and T wave changes: No evidence of acute T wave abnormalities or significant ST segment elevation or depression.  Evidence of a possible, age undetermined, prior infarct:  Yes; anterior Comparison: Similar to previous tracing obtained on  08/24/2020 NOTE: Tracing obtained at [facility]; unable for review. Above based on cardiologist's interpretation.    IMAGING / PROCEDURES: TRANSTHORACIC ECHOCARDIOGRAM performed on 12/23/2023 Normal  left ventricular systolic function with an EF of >55% Mild LVH No regional wall motion abnormalities Left ventricular diastolic Doppler parameters consistent with pseudonormalization (G2DD). Normal right ventricular size and function Mild pan valvular regurgitation RVSP = 35 mmHg  Normal transvalvular gradients; no valvular stenosis  MR LUMBAR SPINE W WO CONTRAST performed on 12/14/2023 L3-4: Intermediate-sized left asymmetric disc bulge with narrowing of the left lateral recess and moderate left foraminal stenosis. Extraforaminal disc component has been resected. L4-5: Unchanged intermediate-sized disc bulge with endplate swelling and severe right and mild left foraminal stenosis.  CT RENAL STONE STUDY performed on 09/13/2023 Small hiatal hernia Subcentimeter LEFT hepatic cyst (benign) 2.3 cm simple LEFT upper pole renal cyst (benign) 2 mm distal LEFT ureteral calculus just above the UVJ with mild LEFT hydronephrosis Status post prostatectomy BILATERAL hydroceles; moderate left and small right Atherosclerotic calcifications of the abdominal aorta and branch vessels  MR BRAIN W WO CONTRAST performed on 07/07/2023 No significant change in the size of 3.7 x 2.2 cm meningioma centered along the right cavernous sinus. Mild chronic microvascular ischemic changes.  DIAGNOSTIC RADIOGRAPHS OF LEFT KNEE 4+ VIEWS performed on 06/26/2023 Complete loss of joint space in the medial compartment of the left knee with varus deformity and sclerotic changes along the medial tibial plateau. Patellofemoral osteoarthritis with normal tracking of the patella in the trochlear groove.   Mid PA flexion view shows no valgus or varus alignment but continued joint space narrowing with complete loss of joint  space in the medial compartment.   MYOCARDIAL PERFUSION IMAGING STUDY (LEXISCAN) performed on 06/03/2023 Normal left ventricular systolic function with a normal LVEF of 60 to % Normal myocardial thickening and wall motion Left ventricular cavity size normal SPECT images demonstrate homogenous tracer distribution throughout the myocardium TID ratio = 0.95 No evidence of stress-induced myocardial ischemia or arrhythmia Normal low risk study  IMPRESSION AND PLAN: Vincent Patton has been referred for pre-anesthesia review and clearance prior to him undergoing the planned anesthetic and procedural courses. Available labs, pertinent testing, and imaging results were personally reviewed by me in preparation for upcoming operative/procedural course. [facility] medical record has been updated following extensive record review and patient interview with PAT staff.   This patient has been appropriately cleared by cardiology with an overall LOW risk of patient experiencing significant perioperative cardiovascular complications. Based on clinical review performed today (01/29/24), barring any significant acute changes in the patient's overall condition, it is anticipated that he will be able to proceed with the planned surgical intervention. Any acute changes in clinical condition may necessitate his procedure being postponed and/or cancelled. Patient will meet with anesthesia team (MD and/or CRNA) on the day of his procedure for preoperative evaluation/assessment. Questions regarding anesthetic course will be fielded at that time.   Pre-surgical instructions were reviewed with the patient during his PAT appointment, and questions were fielded to satisfaction by PAT clinical staff. He has been instructed on which medications that he will need to hold prior to surgery, as well as the ones that have been deemed safe/appropriate to take on the day of his procedure. As part of the general education provided by PAT,  patient made aware both verbally and in writing, that he would need to abstain from the use of any illegal substances during his perioperative course. He was advised that failure to follow the provided instructions could necessitate case cancellation or result in serious perioperative complications up to and including death. Patient encouraged to contact PAT and/or his  surgeon's office to discuss any questions or concerns that may arise prior to surgery; verbalized understanding.   Dorise Pereyra, MSN, APRN, FNP-C, CEN [facility]  Perioperative Services Nurse Practitioner Phone: [phone] Fax: [phone] 01/29/24 8:49 AM  NOTE: This note has been prepared using Dragon dictation software. Despite my best ability to proofread, there is always the potential that unintentional transcriptional errors may still occur from this process.

## 2024-01-31 MED ORDER — CEFAZOLIN SODIUM-DEXTROSE 2-4 GM/100ML-% IV SOLN
2.0000 g | INTRAVENOUS | Status: AC
  Administered 2024-02-01: 2 g via INTRAVENOUS

## 2024-01-31 MED ORDER — CHLORHEXIDINE GLUCONATE 0.12 % MT SOLN
15.0000 mL | Freq: Once | OROMUCOSAL | Status: AC
Start: 2024-01-31 — End: 2024-02-01
  Administered 2024-02-01: 15 mL via OROMUCOSAL

## 2024-01-31 MED ORDER — LACTATED RINGERS IV SOLN: INTRAVENOUS | Status: DC

## 2024-01-31 MED ORDER — TRANEXAMIC ACID-NACL 1000-0.7 MG/100ML-% IV SOLN
1000.0000 mg | INTRAVENOUS | Status: AC
  Administered 2024-02-01 (×2): 1000 mg via INTRAVENOUS

## 2024-01-31 MED ORDER — ORAL CARE MOUTH RINSE
15.0000 mL | Freq: Once | OROMUCOSAL | Status: AC
Start: 2024-01-31 — End: 2024-02-01

## 2024-01-31 MED ORDER — DEXAMETHASONE SODIUM PHOSPHATE 10 MG/ML IJ SOLN: 8.0000 mg | Freq: Once | INTRAMUSCULAR | Status: DC

## 2024-02-01 ENCOUNTER — Encounter
Admission: RE | Disposition: A | Discharge status: Home Health | Payer: Self-pay | Source: Home / Self Care | Attending: Orthopedic Surgery

## 2024-02-01 ENCOUNTER — Ambulatory Visit: Payer: Self-pay | Admitting: Urgent Care

## 2024-02-01 ENCOUNTER — Observation Stay
Admission: RE | Admit: 2024-02-01 | Discharge: 2024-02-02 | Disposition: A | Discharge status: Home Health | Attending: Orthopedic Surgery | Admitting: Orthopedic Surgery

## 2024-02-01 ENCOUNTER — Encounter: Payer: Self-pay | Admitting: Orthopedic Surgery

## 2024-02-01 ENCOUNTER — Other Ambulatory Visit: Payer: Self-pay

## 2024-02-01 DIAGNOSIS — Z87891 Personal history of nicotine dependence: Secondary | ICD-10-CM | POA: Insufficient documentation

## 2024-02-01 DIAGNOSIS — I2510 Atherosclerotic heart disease of native coronary artery without angina pectoris: Secondary | ICD-10-CM | POA: Insufficient documentation

## 2024-02-01 DIAGNOSIS — Z85828 Personal history of other malignant neoplasm of skin: Secondary | ICD-10-CM | POA: Diagnosis not present

## 2024-02-01 DIAGNOSIS — Z7982 Long term (current) use of aspirin: Secondary | ICD-10-CM | POA: Insufficient documentation

## 2024-02-01 DIAGNOSIS — M1712 Unilateral primary osteoarthritis, left knee: Secondary | ICD-10-CM | POA: Diagnosis not present

## 2024-02-01 DIAGNOSIS — Z79899 Other long term (current) drug therapy: Secondary | ICD-10-CM | POA: Insufficient documentation

## 2024-02-01 DIAGNOSIS — M25562 Pain in left knee: Secondary | ICD-10-CM | POA: Diagnosis present

## 2024-02-01 DIAGNOSIS — Z96652 Presence of left artificial knee joint: Principal | ICD-10-CM

## 2024-02-01 DIAGNOSIS — I10 Essential (primary) hypertension: Secondary | ICD-10-CM | POA: Diagnosis not present

## 2024-02-01 HISTORY — DX: Cyst of kidney, acquired: N28.1

## 2024-02-01 HISTORY — DX: Other forms of dyspnea: R06.09

## 2024-02-01 HISTORY — DX: Pulmonary hypertension, unspecified: I27.20

## 2024-02-01 HISTORY — DX: Other ill-defined heart diseases: I51.89

## 2024-02-01 HISTORY — DX: Obstructive sleep apnea (adult) (pediatric): G47.33

## 2024-02-01 HISTORY — DX: Atherosclerotic heart disease of native coronary artery without angina pectoris: I25.10

## 2024-02-01 HISTORY — DX: Herpesviral infection of urogenital system, unspecified: A60.00

## 2024-02-01 HISTORY — DX: Hydrocele, unspecified: N43.3

## 2024-02-01 HISTORY — DX: Squamous cell carcinoma of skin, unspecified: C44.92

## 2024-02-01 HISTORY — DX: Calculus of kidney: N20.0

## 2024-02-01 HISTORY — DX: Other specified diseases of liver: K76.89

## 2024-02-01 HISTORY — DX: Other nonspecific abnormal finding of lung field: R91.8

## 2024-02-01 HISTORY — DX: Atherosclerosis of aorta: I70.0

## 2024-02-01 HISTORY — DX: Actinic keratosis: L57.0

## 2024-02-01 HISTORY — DX: Long term (current) use of aspirin: Z79.82

## 2024-02-01 HISTORY — PX: TOTAL KNEE ARTHROPLASTY: SHX125

## 2024-02-01 HISTORY — DX: Male erectile dysfunction, unspecified: N52.9

## 2024-02-01 HISTORY — DX: Other cerebrovascular disease: I67.89

## 2024-02-01 SURGERY — ARTHROPLASTY, KNEE, TOTAL
Anesthesia: General | Site: Knee | Laterality: Left

## 2024-02-01 MED ORDER — ONDANSETRON HCL 4 MG PO TABS
4.0000 mg | ORAL_TABLET | Freq: Four times a day (QID) | ORAL | 0 refills | Status: DC | PRN
  Filled 2024-02-01: qty 20, 5d supply, fill #0

## 2024-02-01 MED ORDER — DOCUSATE SODIUM 100 MG PO CAPS
100.0000 mg | ORAL_CAPSULE | Freq: Two times a day (BID) | ORAL | Status: DC
  Administered 2024-02-02: 100 mg via ORAL
  Filled 2024-02-01 (×2): qty 1

## 2024-02-01 MED ORDER — LACTATED RINGERS IV SOLN: INTRAVENOUS | Status: DC | PRN

## 2024-02-01 MED ORDER — DEXAMETHASONE SODIUM PHOSPHATE 10 MG/ML IJ SOLN
INTRAMUSCULAR | Status: DC | PRN
  Administered 2024-02-01: 10 mg via INTRAVENOUS

## 2024-02-01 MED ORDER — EPHEDRINE SULFATE-NACL 50-0.9 MG/10ML-% IV SOSY
PREFILLED_SYRINGE | INTRAVENOUS | Status: DC | PRN
  Administered 2024-02-01 (×2): 10 mg via INTRAVENOUS
  Administered 2024-02-01: 5 mg via INTRAVENOUS

## 2024-02-01 MED ORDER — ROCURONIUM BROMIDE 100 MG/10ML IV SOLN
INTRAVENOUS | Status: DC | PRN
  Administered 2024-02-01: 20 mg via INTRAVENOUS
  Administered 2024-02-01: 50 mg via INTRAVENOUS
  Administered 2024-02-01: 20 mg via INTRAVENOUS

## 2024-02-01 MED ORDER — BUPIVACAINE-EPINEPHRINE (PF) 0.25% -1:200000 IJ SOLN
INTRAMUSCULAR | Status: AC
  Filled 2024-02-01: qty 30

## 2024-02-01 MED ORDER — ACETAMINOPHEN 325 MG PO TABS: 325.0000 mg | ORAL_TABLET | Freq: Four times a day (QID) | ORAL | Status: DC | PRN

## 2024-02-01 MED ORDER — MIDAZOLAM HCL 2 MG/2ML IJ SOLN
INTRAMUSCULAR | Status: AC
  Filled 2024-02-01: qty 2

## 2024-02-01 MED ORDER — OXYCODONE HCL 5 MG PO TABS
ORAL_TABLET | ORAL | Status: AC
  Filled 2024-02-01: qty 1

## 2024-02-01 MED ORDER — TRANEXAMIC ACID-NACL 1000-0.7 MG/100ML-% IV SOLN
INTRAVENOUS | Status: AC
  Filled 2024-02-01: qty 100

## 2024-02-01 MED ORDER — OXYCODONE HCL 5 MG PO TABS
5.0000 mg | ORAL_TABLET | Freq: Once | ORAL | Status: AC | PRN
  Administered 2024-02-01: 5 mg via ORAL

## 2024-02-01 MED ORDER — CEFAZOLIN SODIUM-DEXTROSE 2-4 GM/100ML-% IV SOLN
INTRAVENOUS | Status: AC
  Filled 2024-02-01: qty 100

## 2024-02-01 MED ORDER — METOCLOPRAMIDE HCL 10 MG PO TABS: 5.0000 mg | ORAL_TABLET | Freq: Three times a day (TID) | ORAL | Status: DC | PRN

## 2024-02-01 MED ORDER — PHENYLEPHRINE 80 MCG/ML (10ML) SYRINGE FOR IV PUSH (FOR BLOOD PRESSURE SUPPORT)
PREFILLED_SYRINGE | INTRAVENOUS | Status: AC
  Filled 2024-02-01: qty 10

## 2024-02-01 MED ORDER — METOCLOPRAMIDE HCL 5 MG/ML IJ SOLN: 5.0000 mg | Freq: Three times a day (TID) | INTRAMUSCULAR | Status: DC | PRN

## 2024-02-01 MED ORDER — SURGIPHOR WOUND IRRIGATION SYSTEM - OPTIME: TOPICAL | Status: DC | PRN

## 2024-02-01 MED ORDER — HYDROMORPHONE HCL 1 MG/ML IJ SOLN
INTRAMUSCULAR | Status: DC | PRN
  Administered 2024-02-01: 1 mg via INTRAVENOUS

## 2024-02-01 MED ORDER — ACETAMINOPHEN 500 MG PO TABS
1000.0000 mg | ORAL_TABLET | Freq: Three times a day (TID) | ORAL | 0 refills | Status: DC
  Filled 2024-02-01: qty 30, 5d supply, fill #0

## 2024-02-01 MED ORDER — SUGAMMADEX SODIUM 200 MG/2ML IV SOLN
INTRAVENOUS | Status: DC | PRN
  Administered 2024-02-01: 200 mg via INTRAVENOUS

## 2024-02-01 MED ORDER — ONDANSETRON HCL 4 MG/2ML IJ SOLN: 4.0000 mg | Freq: Four times a day (QID) | INTRAMUSCULAR | Status: DC | PRN

## 2024-02-01 MED ORDER — OXYCODONE HCL 5 MG PO TABS: 5.0000 mg | ORAL_TABLET | ORAL | Status: DC | PRN

## 2024-02-01 MED ORDER — PHENYLEPHRINE 80 MCG/ML (10ML) SYRINGE FOR IV PUSH (FOR BLOOD PRESSURE SUPPORT)
PREFILLED_SYRINGE | INTRAVENOUS | Status: DC | PRN
Start: 2024-02-01 — End: 2024-02-01
  Administered 2024-02-01: 80 ug via INTRAVENOUS

## 2024-02-01 MED ORDER — ASPIRIN 81 MG PO CHEW
81.0000 mg | CHEWABLE_TABLET | Freq: Every day | ORAL | Status: DC
  Administered 2024-02-02: 81 mg via ORAL
  Filled 2024-02-01: qty 1

## 2024-02-01 MED ORDER — BUPIVACAINE LIPOSOME 1.3 % IJ SUSP
INTRAMUSCULAR | Status: AC
  Filled 2024-02-01: qty 20

## 2024-02-01 MED ORDER — SODIUM CHLORIDE (PF) 0.9 % IJ SOLN
INTRAMUSCULAR | Status: DC | PRN
  Administered 2024-02-01: 70 mL

## 2024-02-01 MED ORDER — ACETAMINOPHEN 500 MG PO TABS
1000.0000 mg | ORAL_TABLET | Freq: Three times a day (TID) | ORAL | Status: DC
  Administered 2024-02-01 – 2024-02-02 (×3): 1000 mg via ORAL
  Filled 2024-02-01 (×3): qty 2

## 2024-02-01 MED ORDER — PHENOL 1.4 % MT LIQD: 1.0000 | OROMUCOSAL | Status: DC | PRN

## 2024-02-01 MED ORDER — SODIUM CHLORIDE (PF) 0.9 % IJ SOLN
INTRAMUSCULAR | Status: AC
  Filled 2024-02-01: qty 20

## 2024-02-01 MED ORDER — AMLODIPINE BESYLATE 10 MG PO TABS
5.0000 mg | ORAL_TABLET | Freq: Every day | ORAL | Status: DC
  Administered 2024-02-01 – 2024-02-02 (×2): 5 mg via ORAL
  Filled 2024-02-01 (×2): qty 1

## 2024-02-01 MED ORDER — FENTANYL CITRATE (PF) 100 MCG/2ML IJ SOLN
INTRAMUSCULAR | Status: AC
  Filled 2024-02-01: qty 2

## 2024-02-01 MED ORDER — OXYCODONE HCL 5 MG/5ML PO SOLN: 5.0000 mg | Freq: Once | ORAL | Status: AC | PRN

## 2024-02-01 MED ORDER — PROPOFOL 10 MG/ML IV BOLUS
INTRAVENOUS | Status: DC | PRN
  Administered 2024-02-01: 150 mg via INTRAVENOUS

## 2024-02-01 MED ORDER — MEMANTINE HCL 5 MG PO TABS
2.5000 mg | ORAL_TABLET | Freq: Two times a day (BID) | ORAL | Status: DC
  Administered 2024-02-01 – 2024-02-02 (×2): 2.5 mg via ORAL
  Filled 2024-02-01 (×2): qty 1

## 2024-02-01 MED ORDER — DOCUSATE SODIUM 100 MG PO CAPS
100.0000 mg | ORAL_CAPSULE | Freq: Two times a day (BID) | ORAL | 0 refills | Status: DC
  Filled 2024-02-01: qty 10, 5d supply, fill #0

## 2024-02-01 MED ORDER — FENTANYL CITRATE (PF) 100 MCG/2ML IJ SOLN
INTRAMUSCULAR | Status: DC | PRN
  Administered 2024-02-01 (×2): 50 ug via INTRAVENOUS

## 2024-02-01 MED ORDER — CHLORHEXIDINE GLUCONATE 0.12 % MT SOLN
OROMUCOSAL | Status: AC
  Filled 2024-02-01: qty 15

## 2024-02-01 MED ORDER — ACETAMINOPHEN 10 MG/ML IV SOLN
INTRAVENOUS | Status: DC | PRN
  Administered 2024-02-01: 1000 mg via INTRAVENOUS

## 2024-02-01 MED ORDER — TIMOLOL MALEATE 0.5 % OP SOLN
1.0000 [drp] | Freq: Two times a day (BID) | OPHTHALMIC | Status: DC
  Administered 2024-02-01: 1 [drp] via OPHTHALMIC
  Filled 2024-02-01: qty 5

## 2024-02-01 MED ORDER — ENOXAPARIN SODIUM 40 MG/0.4ML IJ SOSY
40.0000 mg | PREFILLED_SYRINGE | INTRAMUSCULAR | 0 refills | Status: DC
  Filled 2024-02-01: qty 5.6, 14d supply, fill #0

## 2024-02-01 MED ORDER — SODIUM CHLORIDE 0.9 % IR SOLN
Status: DC | PRN
  Administered 2024-02-01: 3000 mL

## 2024-02-01 MED ORDER — OXYCODONE HCL 5 MG PO TABS: 7.5000 mg | ORAL_TABLET | ORAL | Status: DC | PRN

## 2024-02-01 MED ORDER — HYDROMORPHONE HCL 1 MG/ML IJ SOLN
INTRAMUSCULAR | Status: AC
  Filled 2024-02-01: qty 1

## 2024-02-01 MED ORDER — FENTANYL CITRATE (PF) 100 MCG/2ML IJ SOLN: 25.0000 ug | INTRAMUSCULAR | Status: DC | PRN

## 2024-02-01 MED ORDER — MENTHOL 3 MG MT LOZG: 1.0000 | LOZENGE | OROMUCOSAL | Status: DC | PRN

## 2024-02-01 MED ORDER — MORPHINE SULFATE (PF) 2 MG/ML IV SOLN: 0.5000 mg | INTRAVENOUS | Status: DC | PRN

## 2024-02-01 MED ORDER — PANTOPRAZOLE SODIUM 40 MG PO TBEC
40.0000 mg | DELAYED_RELEASE_TABLET | Freq: Every day | ORAL | Status: DC
  Administered 2024-02-01 – 2024-02-02 (×2): 40 mg via ORAL
  Filled 2024-02-01 (×2): qty 1

## 2024-02-01 MED ORDER — LIDOCAINE HCL (CARDIAC) PF 100 MG/5ML IV SOSY
PREFILLED_SYRINGE | INTRAVENOUS | Status: DC | PRN
  Administered 2024-02-01: 100 mg via INTRAVENOUS

## 2024-02-01 MED ORDER — CEFAZOLIN SODIUM-DEXTROSE 2-4 GM/100ML-% IV SOLN
2.0000 g | Freq: Four times a day (QID) | INTRAVENOUS | Status: AC
  Administered 2024-02-01 (×2): 2 g via INTRAVENOUS
  Filled 2024-02-01: qty 100

## 2024-02-01 MED ORDER — ONDANSETRON HCL 4 MG PO TABS: 4.0000 mg | ORAL_TABLET | Freq: Four times a day (QID) | ORAL | Status: DC | PRN

## 2024-02-01 MED ORDER — SODIUM CHLORIDE 0.9 % IV SOLN
INTRAVENOUS | Status: DC
Start: 2024-02-01 — End: 2024-02-02

## 2024-02-01 MED ORDER — ONDANSETRON HCL 4 MG/2ML IJ SOLN
INTRAMUSCULAR | Status: DC | PRN
  Administered 2024-02-01: 4 mg via INTRAVENOUS

## 2024-02-01 MED ORDER — OXYCODONE HCL 5 MG PO TABS
2.5000 mg | ORAL_TABLET | ORAL | 0 refills | Status: DC | PRN
  Filled 2024-02-01: qty 30, 5d supply, fill #0

## 2024-02-01 MED ORDER — ACETAMINOPHEN 10 MG/ML IV SOLN
INTRAVENOUS | Status: AC
  Filled 2024-02-01: qty 100

## 2024-02-01 MED ORDER — ENOXAPARIN SODIUM 30 MG/0.3ML IJ SOSY
30.0000 mg | PREFILLED_SYRINGE | Freq: Two times a day (BID) | INTRAMUSCULAR | Status: DC
  Administered 2024-02-02: 30 mg via SUBCUTANEOUS
  Filled 2024-02-01: qty 0.3

## 2024-02-01 SURGICAL SUPPLY — 57 items
BLADE PATELLA W-PILOT HOLE 35 (BLADE) IMPLANT
BLADE SAW 90X13X1.19 OSCILLAT (BLADE) IMPLANT
BLADE SAW SAG 25X90X1.19 (BLADE) ×1 IMPLANT
BLADE SAW SAG 29X58X.64 (BLADE) ×1 IMPLANT
BNDG ELASTIC 6INX 5YD STR LF (GAUZE/BANDAGES/DRESSINGS) ×1 IMPLANT
BOWL CEMENT MIX W/ADAPTER (MISCELLANEOUS) IMPLANT
BRUSH SCRUB EZ PLAIN DRY (MISCELLANEOUS) IMPLANT
CHLORAPREP W/TINT 26 (MISCELLANEOUS) ×2 IMPLANT
COMPONENT FEM KNEE STD PS 8 LT (Joint) IMPLANT
COMPONENT PATELLA 3 PEG 35 (Joint) IMPLANT
COOLER ICEMAN CLASSIC (MISCELLANEOUS) ×1 IMPLANT
CUFF TRNQT CYL 24X4X16.5-23 (TOURNIQUET CUFF) IMPLANT
CUFF TRNQT CYL 30X4X21-28X (TOURNIQUET CUFF) IMPLANT
DERMABOND ADVANCED .7 DNX12 (GAUZE/BANDAGES/DRESSINGS) ×1 IMPLANT
DRAPE SHEET LG 3/4 BI-LAMINATE (DRAPES) ×2 IMPLANT
DRSG MEPILEX SACRM 8.7X9.8 (GAUZE/BANDAGES/DRESSINGS) ×1 IMPLANT
DRSG OPSITE POSTOP 4X10 (GAUZE/BANDAGES/DRESSINGS) IMPLANT
DRSG OPSITE POSTOP 4X8 (GAUZE/BANDAGES/DRESSINGS) IMPLANT
ELECTRODE REM PT RTRN 9FT ADLT (ELECTROSURGICAL) ×1 IMPLANT
GLOVE BIO SURGEON STRL SZ8 (GLOVE) ×1 IMPLANT
GLOVE BIOGEL PI IND STRL 8 (GLOVE) ×1 IMPLANT
GLOVE PI ORTHO PRO STRL 7.5 (GLOVE) ×2 IMPLANT
GLOVE PI ORTHO PRO STRL SZ8 (GLOVE) ×2 IMPLANT
GLOVE SURG SYN 7.5 PF PI (GLOVE) ×1 IMPLANT
GOWN SRG XL LVL 3 NONREINFORCE (GOWNS) ×1 IMPLANT
GOWN STRL REUS W/ TWL LRG LVL3 (GOWN DISPOSABLE) ×1 IMPLANT
GOWN STRL REUS W/ TWL XL LVL3 (GOWN DISPOSABLE) ×1 IMPLANT
HOOD PEEL AWAY T7 (MISCELLANEOUS) ×2 IMPLANT
INSERT MED AS PERS SZ 8-11 LT (Insert) IMPLANT
KIT TURNOVER KIT A (KITS) ×1 IMPLANT
MANIFOLD NEPTUNE II (INSTRUMENTS) ×1 IMPLANT
MARKER SKIN DUAL TIP RULER LAB (MISCELLANEOUS) ×1 IMPLANT
MAT ABSORB FLUID 56X50 GRAY (MISCELLANEOUS) ×1 IMPLANT
NDL HYPO 21X1.5 SAFETY (NEEDLE) ×1 IMPLANT
NEEDLE HYPO 21X1.5 SAFETY (NEEDLE) ×1 IMPLANT
PACK TOTAL KNEE (MISCELLANEOUS) ×1 IMPLANT
PAD ARMBOARD POSITIONER FOAM (MISCELLANEOUS) ×3 IMPLANT
PAD COLD UNI WRAP-ON (PAD) ×1 IMPLANT
PENCIL SMOKE EVACUATOR (MISCELLANEOUS) ×1 IMPLANT
PIN DRILL HDLS TROCAR 75 4PK (PIN) IMPLANT
SCREW FEMALE HEX FIX 25X2.5 (ORTHOPEDIC DISPOSABLE SUPPLIES) IMPLANT
SCREW HEX HEADED 3.5X27 DISP (ORTHOPEDIC DISPOSABLE SUPPLIES) IMPLANT
SLEEVE SCD COMPRESS KNEE MED (STOCKING) ×1 IMPLANT
SOL .9 NS 3000ML IRR UROMATIC (IV SOLUTION) ×1 IMPLANT
SOLUTION IRRIG SURGIPHOR (IV SOLUTION) ×1 IMPLANT
STEM TIB PERS SZ E 5D LT (Screw) IMPLANT
STOCKINETTE IMPERV 14X48 (MISCELLANEOUS) ×1 IMPLANT
SUT STRATAFIX 14 PDO 36 VLT (SUTURE) ×1 IMPLANT
SUT VIC AB 0 CT1 36 (SUTURE) ×1 IMPLANT
SUT VIC AB 2-0 CT2 27 (SUTURE) ×2 IMPLANT
SUTURE STRATA SPIR 4-0 18 (SUTURE) ×1 IMPLANT
SUTURE VICRYL 1-0 27IN ABS (SUTURE) ×1 IMPLANT
SYR 20ML LL LF (SYRINGE) ×2 IMPLANT
TAPE CLOTH 3X10 WHT NS LF (GAUZE/BANDAGES/DRESSINGS) ×1 IMPLANT
TIP FAN IRRIG PULSAVAC PLUS (DISPOSABLE) ×1 IMPLANT
TRAP FLUID SMOKE EVACUATOR (MISCELLANEOUS) ×1 IMPLANT
WATER STERILE IRR 1000ML POUR (IV SOLUTION) ×1 IMPLANT

## 2024-02-01 NOTE — Transfer of Care (Signed)
 Immediate Anesthesia Transfer of Care Note  Patient: Vincent Patton  Procedure(s) Performed: ARTHROPLASTY, KNEE, TOTAL (Left: Knee)  Patient Location: PACU  Anesthesia Type:General  Level of Consciousness: awake and alert   Airway & Oxygen Therapy: Patient Spontanous Breathing  Post-op Assessment: Report given to RN and Post -op Vital signs reviewed and stable  Post vital signs: stable, oriented to person and place. Asking appropriate questions  Last Vitals:  Vitals Value Taken Time  BP 147/75 02/01/24 12:15  Temp    Pulse 73 02/01/24 12:16  Resp 11 02/01/24 12:16  SpO2 92 % 02/01/24 12:16  Vitals shown include unfiled device data.  Last Pain:  Vitals:   02/01/24 0923  TempSrc: Temporal  PainSc: 0-No pain         Complications: No notable events documented.

## 2024-02-01 NOTE — H&P (Signed)
 History of Present Illness: The patient is an 73 y.o. male seen in clinic today for history and physical for left total knee arthroplasty with Dr. Lorelle on 02/01/2024. Patient has had years of increasing left knee pain. Pain located along the medial and patellofemoral compartment of the left knee. He has had cortisone injections with good relief but has had diminishing results. He has intermittent swelling and pain with walking. He has pain after activity. He has tried ibuprofen with little relief. Pain is moderate with standing and walking and is interfering with his quality of life and activities day living. History of left knee prior arthroscopy. No numbness tingling or radicular symptoms.  Patient is a non-smoker nondiabetic with a BMI of 22.4  Past Medical History: Past Medical History:  Diagnosis Date  Actinic keratoses  Arthritis  Genital herpes  Glaucoma (increased eye pressure)  History of prostate cancer  History of squamous cell carcinoma  Raynaud phenomenon   Past Surgical History: Past Surgical History:  Procedure Laterality Date  TONSILLECTOMY 1960  KNEE ARTHROSCOPY Right 1979  Right knee s/p torn cartilage  KNEE ARTHROSCOPY Right 1983  Right knee s/p torn cartilage  COLONOSCOPY 11/10/2001  Adenomatous Polyps, FH Colon Polyps (Mother); CBF 04/2015; Recall Ltr mailed 03/28/2015 (dw)  LEG SKIN LESION BIOPSY / EXCISION 10/2004  Squamous cell carcinoma  HERNIA REPAIR Right 06/24/2005  Right inguinal  PROSTATECTOMY PERINEAL 06/2008  HERNIA REPAIR Left 2011  Left inguinal  COLONOSCOPY 05/29/2015  PH Adenomatous Polyps, FH Colon Polyps (Mother): CBF 05/2020  COLONOSCOPY 08/14/2020  Normal colon/PHx CP/Repeat 12yrs/TKT  EGD 08/14/2020  Barrett's Esophagus/Erosive esophagitis/Repeat 64yrs/TKT  EGD@[facility]  11/13/2023  Barretts/Rpt34yrs/TKT   Past Family History: Family History  Problem Relation Age of Onset  High blood pressure (Hypertension) Mother  Diabetes type II Mother   Depression Mother  Colon polyps Mother  Stroke Mother  High blood pressure (Hypertension) Father  Myocardial Infarction (Heart attack) Father  s/p CABG  Lung cancer Father  Stroke Father   Medications: Current Outpatient Medications  Medication Sig Dispense Refill  amLODIPine  (NORVASC ) 5 MG tablet TAKE 1 TABLET BY MOUTH DAILY 90 tablet 3  aspirin  81 MG EC tablet Take 81 mg by mouth once daily  cyanocobalamin (VITAMIN B12) 1000 MCG tablet Take 1,000 mcg by mouth once daily  pantoprazole  (PROTONIX ) 40 MG DR tablet Take 1 tablet (40 mg total) by mouth once daily 90 tablet 3  potassium chloride (KLOR-CON M10) 10 mEq ER tablet Take 10 mEq by mouth 2 (two) times daily  sildenafil (REVATIO) 20 mg tablet TAKE 2-5 TABLETS BY MOUTH ONCE DAILY AS NEEDED FOR SEXUAL ACTIVITY 30 tablet 2  timoloL  maleate (TIMOPTIC ) 0.5 % ophthalmic solution Place 1 drop into both eyes 2 (two) times daily   No current facility-administered medications for this visit.   Allergies: Allergies  Allergen Reactions  Flomax [Tamsulosin] Shortness Of Breath and Other (See Comments)  Feels like he will pass out while on it with SOB  Lodine [Etodolac] Anxiety  Depression, ulcers in mouth and suicidal thoughts.    Visit Vitals: Vitals:  01/19/24 1414  BP: 132/82    Review of Systems:  A comprehensive 14 point ROS was performed, reviewed, and the pertinent orthopaedic findings are documented in the HPI.  Physical Exam: General:  Well developed, well nourished, no apparent distress, normal affect, normal gait with no antalgic component. Alert and oriented to person, place, time, year.   HEENT: Head normocephalic, atraumatic, PERRL.   Abdomen: Soft, non tender, non distended, Bowel  sounds present.  Heart: Examination of the heart reveals regular, rate, and rhythm. There is no murmur noted on ascultation. There is a normal apical pulse.  Lungs: Lungs are clear to auscultation. There is no wheeze, rhonchi,  or crackles. There is normal expansion of bilateral chest walls.   Comprehensive Knee Exam: Gait Non-antalgic and fluid  Alignment Neutral   Inspection Right Left  Skin Normal appearance with no obvious deformity. Healed curvilinear scar over the medial knee no ecchymosis or erythema. Normal appearance with no obvious deformity. Healed arthroscopic portals no ecchymosis or erythema.  Soft Tissue No focal soft tissue swelling No focal soft tissue swelling  Quad Atrophy None None   Palpation  Right Left  Tenderness Medial joint line tenderness to palpation Joint line tenderness to palpation  Crepitus + patellofemoral crepitus + tibiofemoral crepitus  Effusion None None   Range of Motion Right Left  Flexion 0-95 0-110  Extension Full knee extension without hyperextension Full knee extension without hyperextension   Ligamentous Exam Right Left  Lachman Normal Normal  Valgus 0 Normal Normal  Valgus 30 Normal Normal  Varus 0 Normal Normal  Varus 30 Normal Normal  Anterior Drawer Normal Normal  Posterior Drawer Normal Normal   Meniscal Exam Right Left  Hyperflexion Test Negative Negative  Hyperextension Test Negative Positive  McMurray's Negative Positive    Neurovascular Right Left  Quadriceps Strength 5/5 5/5  Hamstring Strength 5/5 5/5  Hip Abductor Strength 4/5 4/5  Distal Motor Normal Normal  Distal Sensory Normal light touch sensation Normal light touch sensation  Distal Pulses Normal Normal    Imaging Studies: AP lateral sunrise views of the left knee are reviewed by me in the office today. Impression: Patient has complete loss of joint space in the medial compartment of the left knee with varus deformity and sclerotic changes along the medial tibial plateau. Patellofemoral osteoarthritis with normal tracking of the patella in the trochlear groove. Mid PA flexion view shows no valgus or varus alignment but continued joint space narrowing with complete loss of joint  space in the medial compartment.   Assessment:  Advanced left knee osteoarthritis  Plan: Garrel is a 73 year old male presents for history and physical for left total knee replacement with Dr. Lorelle on 02/01/2024. Patient has advanced left knee degenerative arthritis. Patient is bone-on-bone of the medial compartment. He has had years of increasing pain with diminishing results with conservative treatment. Risks, benefits, complications of a left total knee arthroplasty have been discussed with the patient. Patient has agreed and consented the procedure with Dr. Lorelle on 02/01/2024.   The hospitalization and post-operative care and rehabilitation were also discussed. The use of perioperative antibiotics and DVT prophylaxis were discussed. The risk, benefits and alternatives to a surgical intervention were discussed at length with the patient. The patient was also advised of risks related to the medical comorbidities and elevated body mass index (BMI). A lengthy discussion took place to review the most common complications including but not limited to: stiffness, loss of function, complex regional pain syndrome, deep vein thrombosis, pulmonary embolus, heart attack, stroke, infection, wound breakdown, numbness, intraoperative fracture, damage to nerves, tendon,muscles, arteries or other blood vessels, death and other possible complications from anesthesia. The patient was told that we will take steps to minimize these risks by using sterile technique, antibiotics and DVT prophylaxis when appropriate and follow the patient postoperatively in the office setting to monitor progress. The possibility of recurrent pain, no improvement in pain and actual worsening  of pain were also discussed with the patient.   Case reviewed with patient and family, history updated. Patient agrees with plan for left total knee arthroplasty.

## 2024-02-01 NOTE — Op Note (Signed)
 Patient Name: Vincent Patton  FMW:[id number]  Pre-Operative Diagnosis: Left knee Osteoarthritis  Post-Operative Diagnosis: (same)  Procedure: Left Total Knee Arthroplasty  Components/Implants: Femur: Persona Size 8 CR PPS   Tibia: Persona Size E OsseoTi  Poly: 11mm MC  Patella: 35x9.25mm symmetric  OsseoTi  Femoral Valgus Cut Angle: 5 degrees  Distal Femoral Re-cut: none  Patella Resurfacing: yes   Date of Surgery: 02/01/2024  Surgeon: Arthea Sheer MD  Assistant: Debby Amber PA (present and scrubbed throughout the case, critical for assistance with exposure, retraction, instrumentation, and closure)   Anesthesiologist: Piscitello  Anesthesia: General   Tourniquet Time: 48 min  EBL: 50cc  IVF: 1000cc  Complications: None   Brief history: The patient is a 73 year old male with a history of osteoarthritis of the left knee with pain limiting their range of motion and activities of daily living, which has failed multiple attempts at conservative therapy.  The risks and benefits of total knee arthroplasty as definitive surgical treatment were discussed with the patient, who opted to proceed with the operation.  After outpatient medical clearance and optimization was completed the patient was admitted to [facility] for the procedure.  All preoperative films were reviewed and an appropriate surgical plan was made prior to surgery. Preoperative range of motion was 0 to 110  Description of procedure: The patient was brought to the operating room where laterality was confirmed by all those present to be the leftside.   Spinal anesthesia was administered and the patient received an intravenous dose of antibiotics for surgical prophylaxis and a dose of tranexamic acid .  Patient is positioned supine on the operating room table with all bony prominences well-padded.  A well-padded tourniquet was applied to the left thigh.  The knee was then prepped and draped in usual  sterile fashion with multiple layers of adhesive and nonadhesive drapes.  All of those present in the operating room participated in a surgical timeout laterality and patient were confirmed.   An Esmarch was wrapped around the extremity and the leg was elevated and the knee flexed.  The tourniquet was inflated to a pressure of 250 mmHg. The Esmarch was removed and the leg was brought down to full extension.  The patella and tibial tubercle identified and outlined using a marking pen and a midline skin incision was made with a knife carried through the subcutaneous tissue down to the extensor retinaculum.  After exposure of the extensor mechanism the medial parapatellar arthrotomy was performed with a scalpel and electrocautery extending down medial and distal to the tibial tubercle taking care to avoid incising the patellar tendon.   A standard medial release was performed over the proximal tibia.  The knee was brought into extension in order to excise the fat pad taking care not to damage the patella tendon.  The superior soft tissue was removed from the anterior surface of the distal femur to visualize for the procedure.  The knee was then brought into flexion with the patella subluxed laterally and subluxing the tibia anteriorly.  The ACL was transected and removed with electrocautery and additional soft tissue was removed from the proximal surface of the tibia to fully expose. The PCL was found to be intact and was preserved.  An extramedullary tibial cutting guide was then applied to the leg with a spring-loaded ankle clamp placed around the distal tibia just above the malleoli the angulation of the guide was adjusted to give some posterior slope in the tibial resection with an appropriate  varus/valgus alignment.  The resection guide was then pinned to the proximal tibia and the proximal tibial surface was resected with an oscillating saw.  Careful attention was paid to ensure the blade did not disrupt any  of the soft tissues including any lateral or medial ligament.  Attention was then turned to the femur, with the knee slightly flexed a opening drill was used to enter the medullary canal of the femur.  After removing the drill marrow was suctioned out to decompress the distal femur.  An intramedullary femoral guide was then inserted into the drill hole and the alignment guide was seated firmly against the distal end of the medial femoral condyle.  The distal femoral cutting guide was then attached and pinned securely to the anterior surface of the femur and the intramedullary rod and alignment guide was removed.  Distal femur resection was then performed with an oscillating saw with retractors protecting medial and laterally.   The distal cutting block was then removed and the extension gap was checked with a spacer.  Extension gap was found to be appropriately sized to accommodate the spacer block.   The femoral sizing guide was then placed securely into the posterior condyles of the femur and the femoral size was measured and determined to be 8.  The size 8; 4-in-1 cutting guide was placed in position and secured with 2 pins.  The anterior posterior and chamfer resections were then performed with an oscillating saw.  Bony fragments and osteophytes were then removed.  Using a lamina spreader the posterior medial and lateral condyles were checked for additional osteophytes and posterior soft tissue remnants.  Any remaining meniscus was removed at this time.  Periarticular injection was performed in the meniscal rims and posterior capsule with aspiration performed to ensure no intravascular injection.   The tibia was then exposed and the tibial trial was pinned onto the plateau after confirming appropriate orientation and rotation.  Using the drill bushing the tibia was prepared to the appropriate drill depth.  Tibial broach impactor was then driven through the punch guide using a mallet.  The femoral trial  component was then inserted onto the femur. A trial tibial polyethylene bearing was then placed and the knee was reduced.  The knee achieved full extension with no hyperextension and was found to be balanced in flexion and extension with the trials in place.  The knee was then brought into full extension the patella was everted and held with 2 Kocher clamps.  The articular surface of the patella was then resected with an patella reamer and saw after careful measurement with a caliper.  The patella was then prepared with the drill guide and a trial patella was placed.  The knee was then taken through range of motion and it was found that the patella articulated appropriately with the trochlea and good patellofemoral motion without subluxation.    The correct final components for implantation were confirmed and opened by the circulator nurse.  The knee was irrigated with normal saline via pulsatile lavage to remove any bony debris or soft tissue.  The prepared surface of the tibia was exposed and the tibial component was implanted with good bony contact.  The femoral component was then placed and impacted showing good coverage and a good snug fit.  The patella was then cleared off and the patella compression tool was used to apply the patellar component with symmetric compression onto the patella.  The tibial component was then irrigated and cleared of  any debris and a real polyethylene component was placed and engaged with the locking mechanism.  The knee was then injected with the particular cocktail.  The knee was taken through range of motion and found to be stable in flexion and extension with patellar tracking.  The knee was then irrigated with copious amount of normal saline via pulsatile lavage to remove all loose bodies and other debris.  The knee was then irrigated with surgiphor betadine based wash and reirrigated with saline.  The tourniquet was then dropped and all bleeding vessels were identified and  coagulated.  The arthrotomy was approximated with #1 Vicryl and closed with #1 Stratafix suture.  The knee was brought into slight flexion and the subcutaneous tissues were closed with 0 Vicryl, 2-0 Vicryl and a running subcuticular 4-0 stratafix barbed suture.  Skin was then glued with Dermabond.  A sterile adhesive dressing was then placed along with a sequential compression device to the calf, a Ted stocking, and a cryotherapy cuff.   Sponge, needle, and Lap counts were all correct at the end of the case.   The patient was transferred off of the operating room table to a hospital bed, good pulses were found distally on the operative side.  The patient was transferred to the recovery room in stable condition.

## 2024-02-01 NOTE — Anesthesia Preprocedure Evaluation (Addendum)
 Anesthesia Evaluation  Patient identified by MRN, date of birth, ID band Patient awake    Reviewed: Allergy & Precautions, NPO status , Patient's Chart, lab work & pertinent test results  History of Anesthesia Complications Negative for: history of anesthetic complications  Airway Mallampati: III  TM Distance: <3 FB Neck ROM: full    Dental  (+) Chipped   Pulmonary neg shortness of breath, sleep apnea and Continuous Positive Airway Pressure Ventilation , former smoker   Pulmonary exam normal        Cardiovascular Exercise Tolerance: Good hypertension, + CAD  Normal cardiovascular exam     Neuro/Psych       Dementia  Neuromuscular disease    GI/Hepatic Neg liver ROS, hiatal hernia, PUD,GERD  Controlled,,  Endo/Other  negative endocrine ROS    Renal/GU Renal disease     Musculoskeletal   Abdominal   Peds  Hematology negative hematology ROS (+)   Anesthesia Other Findings Past Medical History: No date: Actinic keratosis No date: Aortic atherosclerosis (HCC) No date: Barrett's esophagus without dysplasia No date: Bilateral hydrocele No date: Bilateral leg numbness No date: Bradycardia No date: CAD (coronary artery disease) No date: Cerebral microvascular disease No date: Cognitive impairment     Comment:  a.) on NMDA receptor antagonist (memantine ) No date: Diastolic dysfunction No date: Dizziness No date: DOE (dyspnea on exertion) No date: Erectile dysfunction     Comment:  a.) on PDE5i (sildenafil) No date: Erosive esophagitis No date: Genital herpes No date: GERD (gastroesophageal reflux disease) No date: Glaucoma No date: H/O bilateral inguinal hernia repair No date: Hepatic cyst No date: Hiatal hernia 2010: History of prostate cancer s/p prostatectomy No date: Hx of adenomatous colonic polyps No date: Hyperlipidemia, mixed No date: Hypertension, essential No date: Long-term use of aspirin   therapy No date: Lumbar facet arthropathy No date: Lumbar spondylosis     Comment:  a.) s/p RIGHT far lateral metrx hemilaminectomy and               discectomy (L4-L5) 11/24/2018 No date: Meningioma (HCC) No date: Multiple pulmonary nodules determined by computed tomography  of lung No date: Nephrolithiasis No date: OSA on CPAP No date: Pedal edema No date: Primary osteoarthritis of left knee No date: Pulmonary hypertension (HCC) No date: Raynaud phenomenon No date: Renal cyst, left No date: Squamous cell skin cancer  Past Surgical History: No date: CARPAL TUNNEL RELEASE; Right No date: ESOPHAGOGASTRODUODENOSCOPY No date: INGUINAL HERNIA REPAIR; Bilateral 1983: knee arthoscopy ; Right     Comment:  torn cartlage 1979: KNEE ARTHROSCOPY; Right     Comment:  torn cartlage 10/2004: leg skin lesion biopsy / lesion     Comment:  squamous cell carcinoma 06/2008: PROSTATECTOMY 11/24/2018: RIGHT FAR LATERAL METRX HEMILAMINECTOMY AND DISCECTOMY;  Right     Comment:  Procedure: RIGHT FAR LATERAL METRX HEMILAMINECTOMY AND               DISCECTOMY; Location: [facility] No date: tear ducts surgery      Comment:  as a child 1960: TONSILLECTOMY     Reproductive/Obstetrics negative OB ROS                              Anesthesia Physical Anesthesia Plan  ASA: 3  Anesthesia Plan: General ETT   Post-op Pain Management:    Induction: Intravenous  PONV Risk Score and Plan: Ondansetron , Dexamethasone , Midazolam  and Treatment may vary due to age  or medical condition  Airway Management Planned: Oral ETT  Additional Equipment:   Intra-op Plan:   Post-operative Plan: Extubation in OR  Informed Consent: I have reviewed the patients History and Physical, chart, labs and discussed the procedure including the risks, benefits and alternatives for the proposed anesthesia with the patient or authorized representative who has indicated his/her understanding  and acceptance.     Dental Advisory Given  Plan Discussed with: Anesthesiologist, CRNA and Surgeon  Anesthesia Plan Comments: (Thorough discussion about spinal vs GA with patient, wife and his family due to all of his lumbar problems, back/leg symptoms and lumbar procedures. Also, that the patient is higher risk for post operative cognitive dysfunction with either anesthetic that we use. They elected for GA and voiced assent to the risks.  Patient and family consented for risks of anesthesia including but not limited to:  - adverse reactions to medications - damage to eyes, teeth, lips or other oral mucosa - nerve damage due to positioning  - sore throat or hoarseness - Damage to heart, brain, nerves, lungs, other parts of body or loss of life  They voiced understanding and assent.)         Anesthesia Quick Evaluation

## 2024-02-01 NOTE — Anesthesia Procedure Notes (Signed)
 Procedure Name: Intubation Date/Time: 02/01/2024 10:29 AM  Performed by: Norleen Alberta HERO., CRNAPre-anesthesia Checklist: Patient identified, Patient being monitored, Timeout performed, Emergency Drugs available and Suction available Patient Re-evaluated:Patient Re-evaluated prior to induction Oxygen Delivery Method: Circle system utilized Preoxygenation: Pre-oxygenation with 100% oxygen Induction Type: IV induction Ventilation: Mask ventilation without difficulty Laryngoscope Size: 3 and McGrath Grade View: Grade I Tube type: Oral Tube size: 7.5 mm Number of attempts: 1 Airway Equipment and Method: Stylet Placement Confirmation: ETT inserted through vocal cords under direct vision, positive ETCO2 and breath sounds checked- equal and bilateral Secured at: 21 cm Tube secured with: Tape Dental Injury: Teeth and Oropharynx as per pre-operative assessment

## 2024-02-01 NOTE — Evaluation (Signed)
 Physical Therapy Evaluation Patient Details Name: Vincent Patton MRN: [id number] DOB: 07-Feb-1951 Today's Date: 02/01/2024  History of Present Illness  Pt is a 73 y.o. male s/p L TKA d/t L knee OA 02/01/24.  PMH includes Raynaud phenomenon, h/o SCC, h/o prostate CA, B hernia repair, Barrett's esophagus, sleep apnea, htn, dementia, actinic keratosis, bradycardia, CAD, dizziness, DOE, R carpal tunnel release.  Clinical Impression  Prior to surgery, pt reports being independent with ambulation; lives with his wife in 1 level home with 4 STE R railing; pt's wife plans to assist pt upon discharge home.  L knee pain 0/10 at rest beginning/end of session and up to 5/10 with activities.  L knee AROM flexion to 95 degrees in sitting.  Currently pt is min assist with transfer from recliner and ambulation 10 feet with RW use.  Pt verbalizing being anxious about walking requiring extra time and reassurance.  Pt would currently benefit from skilled PT to address noted impairments and functional limitations (see below for any additional details).  Upon hospital discharge, pt would benefit from ongoing therapy.     If plan is discharge home, recommend the following: A little help with walking and/or transfers;A little help with bathing/dressing/bathroom;Assistance with cooking/housework;Assist for transportation;Help with stairs or ramp for entrance;Supervision due to cognitive status   Can travel by private vehicle    Yes    Equipment Recommendations Rolling walker (2 wheels);BSC/3in1 (Pt has RW and BSC at home already)  Recommendations for Other Services  OT consult    Functional Status Assessment Patient has had a recent decline in their functional status and demonstrates the ability to make significant improvements in function in a reasonable and predictable amount of time.     Precautions / Restrictions Precautions Precautions: Knee;Fall Precaution Booklet Issued: Yes (comment) Recall of  Precautions/Restrictions: Impaired Restrictions Weight Bearing Restrictions Per Provider Order: Yes LLE Weight Bearing Per Provider Order: Weight bearing as tolerated      Mobility  Bed Mobility               General bed mobility comments: Deferred (pt in recliner beginning/end of session at rest)    Transfers Overall transfer level: Needs assistance Equipment used: Rolling walker (2 wheels) Transfers: Sit to/from Stand Sit to Stand: Min assist           General transfer comment: vc's for UE/LE positioning and overall technique    Ambulation/Gait Ambulation/Gait assistance: Min assist Gait Distance (Feet): 10 Feet Assistive device: Rolling walker (2 wheels) Gait Pattern/deviations: Decreased stance time - left Gait velocity: decreased     General Gait Details: antalgic; more short shuffling gait initially; improved step length with vc's  Stairs            Wheelchair Mobility     Tilt Bed    Modified Rankin (Stroke Patients Only)       Balance Overall balance assessment: Needs assistance Sitting-balance support: No upper extremity supported, Feet supported Sitting balance-Leahy Scale: Good Sitting balance - Comments: steady reaching within BOS   Standing balance support: Bilateral upper extremity supported, Reliant on assistive device for balance Standing balance-Leahy Scale: Fair Standing balance comment: steady static standing with B UE support on RW                             Pertinent Vitals/Pain Pain Assessment Pain Assessment: 0-10 Pain Score: 0-No pain (0/10 at rest beginning/end of session; 5/10 with activity) Pain Location:  L knee Pain Descriptors / Indicators: Discomfort Pain Intervention(s): Limited activity within patient's tolerance, Monitored during session, Premedicated before session, Repositioned, Other (comment) (Nurse notified; polar care applied) Pt's HR 67 bpm pre-activity and 69 bpm post ambulation; SpO2  sats 95% or greater on room air when spot checked during session.    Home Living Family/patient expects to be discharged to:: Private residence Living Arrangements: Spouse/significant other Available Help at Discharge: Family;Available 24 hours/day Type of Home: House Home Access: Stairs to enter Entrance Stairs-Rails: Right Entrance Stairs-Number of Steps: 4   Home Layout: One level Home Equipment: Grab bars - tub/shower;Toilet riser;Shower seat - built Charity fundraiser (2 wheels);BSC/3in1      Prior Function Prior Level of Function : Needs assist (d/t dementia)             Mobility Comments: Independent with ambulation.       Extremity/Trunk Assessment   Upper Extremity Assessment Upper Extremity Assessment: Overall WFL for tasks assessed    Lower Extremity Assessment Lower Extremity Assessment: RLE deficits/detail;Generalized weakness;LLE deficits/detail RLE Deficits / Details: R knee flexion to at least 90 degrees (limited d/t R knee discomfort) LLE Deficits / Details: min assist for L LE SLR; at least 3/5 AROM hip flexion and DF/PF LLE: Unable to fully assess due to pain    Cervical / Trunk Assessment Cervical / Trunk Assessment: Normal  Communication   Communication Communication: No apparent difficulties    Cognition Arousal: Alert Behavior During Therapy: Flat affect, Impulsive   PT - Cognitive impairments: History of cognitive impairments                       PT - Cognition Comments: Pt reports mild diagnosis of dementia Following commands: Impaired Following commands impaired: Follows one step commands inconsistently, Follows one step commands with increased time     Cueing Cueing Techniques: Verbal cues, Tactile cues, Visual cues     General Comments General comments (skin integrity, edema, etc.): L knee dressing and polar care in place.  Nursing cleared pt for participation in physical therapy.  Pt agreeable to PT session.  Pt's wife  present entire session.    Exercises Total Joint Exercises Ankle Circles/Pumps: AROM, Strengthening, Both, 10 reps, Supine Quad Sets: AROM, Strengthening, Left, 10 reps, Supine Short Arc Quad: AAROM, Strengthening, Left, 10 reps, Supine Heel Slides: AAROM, Strengthening, Left, 10 reps, Supine Hip ABduction/ADduction: AAROM, Strengthening, Left, 10 reps, Supine Straight Leg Raises: AROM, Strengthening, Left, 10 reps, Supine Goniometric ROM: L knee extension AROM 5 degrees short of neutral; L knee flexion AROM 95 degrees in sitting   Assessment/Plan    PT Assessment Patient needs continued PT services  PT Problem List Decreased strength;Decreased range of motion;Decreased activity tolerance;Decreased balance;Decreased mobility;Decreased knowledge of use of DME;Decreased knowledge of precautions;Decreased skin integrity;Pain       PT Treatment Interventions DME instruction;Gait training;Stair training;Functional mobility training;Therapeutic activities;Therapeutic exercise;Balance training;Patient/family education    PT Goals (Current goals can be found in the Care Plan section)  Acute Rehab PT Goals Patient Stated Goal: to improve walking PT Goal Formulation: With patient/family Time For Goal Achievement: 02/15/24 Potential to Achieve Goals: Good    Frequency BID     Co-evaluation               AM-PAC PT 6 Clicks Mobility  Outcome Measure Help needed turning from your back to your side while in a flat bed without using bedrails?: A Little Help needed moving from lying on  your back to sitting on the side of a flat bed without using bedrails?: A Little Help needed moving to and from a bed to a chair (including a wheelchair)?: A Little Help needed standing up from a chair using your arms (e.g., wheelchair or bedside chair)?: A Little Help needed to walk in hospital room?: A Little Help needed climbing 3-5 steps with a railing? : A Little 6 Click Score: 18    End of  Session Equipment Utilized During Treatment: Gait belt Activity Tolerance: Patient tolerated treatment well Patient left: in chair;with call bell/phone within reach;with nursing/sitter in room;with family/visitor present;with SCD's reapplied;Other (comment) (polar care in place; NT present to assist pt with toileting) Nurse Communication: Mobility status;Precautions;Weight bearing status;Other (comment) (Pt's pain status) PT Visit Diagnosis: Unsteadiness on feet (R26.81);Other abnormalities of gait and mobility (R26.89);Muscle weakness (generalized) (M62.81);Pain Pain - Right/Left: Left Pain - part of body: Knee    Time: 8379-8343 PT Time Calculation (min) (ACUTE ONLY): 36 min   Charges:   PT Evaluation $PT Eval Low Complexity: 1 Low PT Treatments $Therapeutic Exercise: 8-22 mins $Therapeutic Activity: 8-22 mins PT General Charges $$ ACUTE PT VISIT: 1 Visit        Damien Caulk, PT 02/01/24, 5:32 PM

## 2024-02-01 NOTE — Plan of Care (Signed)
  Problem: Activity: Goal: Ability to avoid complications of mobility impairment will improve Outcome: Progressing Goal: Range of joint motion will improve Outcome: Progressing   Problem: Clinical Measurements: Goal: Postoperative complications will be avoided or minimized Outcome: Progressing   Problem: Pain Management: Goal: Pain level will decrease with appropriate interventions Outcome: Progressing   Problem: Skin Integrity: Goal: Will show signs of wound healing Outcome: Progressing   

## 2024-02-01 NOTE — Discharge Instructions (Signed)

## 2024-02-01 NOTE — Discharge Summary (Signed)
 Physician Discharge Summary  Patient ID: Vincent Patton MRN: [id number] DOB/AGE: 1950/12/28 73 y.o.  Admit date: 02/01/2024 Discharge date: 02/02/24  Admission Diagnoses:  Primary osteoarthritis of left knee [M17.12] S/P TKR (total knee replacement), left [Z96.652]   Discharge Diagnoses: Patient Active Problem List   Diagnosis Date Noted   S/P TKR (total knee replacement), left 02/01/2024    Past Medical History:  Diagnosis Date   Actinic keratosis    Aortic atherosclerosis (HCC)    Barrett's esophagus without dysplasia    Bilateral hydrocele    Bilateral leg numbness    Bradycardia    CAD (coronary artery disease)    Cerebral microvascular disease    Cognitive impairment    a.) on NMDA receptor antagonist (memantine )   Diastolic dysfunction    Dizziness    DOE (dyspnea on exertion)    Erectile dysfunction    a.) on PDE5i (sildenafil)   Erosive esophagitis    Genital herpes    GERD (gastroesophageal reflux disease)    Glaucoma    H/O bilateral inguinal hernia repair    Hepatic cyst    Hiatal hernia    History of prostate cancer s/p prostatectomy 2010   Hx of adenomatous colonic polyps    Hyperlipidemia, mixed    Hypertension, essential    Long-term use of aspirin  therapy    Lumbar facet arthropathy    Lumbar spondylosis    a.) s/p RIGHT far lateral metrx hemilaminectomy and discectomy (L4-L5) 11/24/2018   Meningioma (HCC)    Multiple pulmonary nodules determined by computed tomography of lung    Nephrolithiasis    OSA on CPAP    Pedal edema    Primary osteoarthritis of left knee    Pulmonary hypertension (HCC)    Raynaud phenomenon    Renal cyst, left    Squamous cell skin cancer      Transfusion: none   Consultants (if any):   Discharged Condition: Improved  Hospital Course: Vincent Patton is an 73 y.o. male who was admitted 02/01/2024 with a diagnosis of S/P TKR (total knee replacement), left and went to the operating room on 02/01/2024 and underwent the  above named procedures.    Surgeries: Procedure(s): ARTHROPLASTY, KNEE, TOTAL on 02/01/2024 Patient tolerated the surgery well. Taken to PACU where she was stabilized and then transferred to the orthopedic floor.  Started on Lovenox  30 mg q 12 hrs. TEDs and SCDs applied bilaterally. Heels elevated on bed. No evidence of DVT. Negative Homan. Physical therapy started on day #1 for gait training and transfer. OT started day #1 for ADL and assisted devices.  Patient's IV was d/c on day #1. Patient was able to safely and independently complete all PT goals. PT recommending discharge to home.    On post op day #1 patient was stable and ready for discharge to home with HHPT.  Implants:  Femur: Persona Size 8 CR PPS   Tibia: Persona Size E OsseoTi  Poly: 11mm MC  Patella: 35x9.85mm symmetric  OsseoTi   He was given perioperative antibiotics:  Anti-infectives (From admission, onward)    Start     Dose/Rate Route Frequency Ordered Stop   02/01/24 1530  ceFAZolin  (ANCEF ) IVPB 2g/100 mL premix        2 g 200 mL/hr over 30 Minutes Intravenous Every 6 hours 02/01/24 1503 02/02/24 0329   02/01/24 0600  ceFAZolin  (ANCEF ) IVPB 2g/100 mL premix        2 g 200 mL/hr over 30 Minutes Intravenous  On call to O.R. 01/31/24 2227 02/01/24 1024     .  He was given sequential compression devices, early ambulation, and Lovenox  TEDs for DVT prophylaxis.  He benefited maximally from the hospital stay and there were no complications.    Recent vital signs:  Vitals:   02/01/24 1431 02/01/24 1500  BP: 135/76 (!) 140/88  Pulse: 69 67  Resp: (!) 9 16  Temp: (!) 97.5 F (36.4 C) 97.9 F (36.6 C)  SpO2:  98%    Recent laboratory studies:  Lab Results  Component Value Date   HGB 15.8 01/25/2024   HGB 15.1 09/13/2023   HGB 14.9 08/24/2020   Lab Results  Component Value Date   WBC 4.8 01/25/2024   PLT 154 01/25/2024   No results found for: INR Lab Results  Component Value Date   NA 127 (L)  01/25/2024   K 4.6 01/25/2024   CL 81 (L) 01/25/2024   CO2 24 01/25/2024   BUN 12 01/25/2024   CREATININE 0.82 01/25/2024   GLUCOSE 85 01/25/2024    Discharge Medications:   Allergies as of 02/01/2024       Reactions   Lodine [etodolac] Swelling   He says tongue swelled up   Tamsulosin Other (See Comments), Shortness Of Breath   Feels like he will pass out while on it with SOB        Medication List     TAKE these medications    acetaminophen  500 MG tablet Commonly known as: TYLENOL  Take 2 tablets (1,000 mg total) by mouth every 8 (eight) hours.   amLODipine  5 MG tablet Commonly known as: NORVASC  Take 5 mg by mouth daily.   aspirin  81 MG chewable tablet Chew 81 mg by mouth daily.   cholecalciferol 25 MCG (1000 UNIT) tablet Commonly known as: VITAMIN D3 Take 1,000 Units by mouth daily.   cyanocobalamin 1000 MCG tablet Commonly known as: VITAMIN B12 Take 1,000 mcg by mouth daily.   docusate sodium  100 MG capsule Commonly known as: COLACE Take 1 capsule (100 mg total) by mouth 2 (two) times daily.   enoxaparin  40 MG/0.4ML injection Commonly known as: LOVENOX  Inject 0.4 mLs (40 mg total) into the skin daily for 14 days.   memantine  5 MG tablet Commonly known as: NAMENDA  Take 2.5 mg by mouth 2 (two) times daily.   ondansetron  4 MG tablet Commonly known as: ZOFRAN  Take 1 tablet (4 mg total) by mouth every 6 (six) hours as needed for nausea.   oxyCODONE  5 MG immediate release tablet Commonly known as: Oxy IR/ROXICODONE  Take 0.5-1 tablets (2.5-5 mg total) by mouth every 4 (four) hours as needed for breakthrough pain. What changed:  how much to take when to take this reasons to take this   pantoprazole  40 MG tablet Commonly known as: PROTONIX  Take 40 mg by mouth daily.   Potassium 99 MG Tabs Take 99 mg by mouth daily.   sildenafil 20 MG tablet Commonly known as: REVATIO TAKE 2-5 TABLETS BY MOUTH ONCE DAILY AS NEEDED FOR SEXUAL ACTIVITY   timolol   0.5 % ophthalmic solution Commonly known as: TIMOPTIC  Place 1 drop into both eyes 2 (two) times daily.        Diagnostic Studies: No results found.  Disposition:      Follow-up Information     Charlene Debby BROCKS, PA-C Follow up in 2 week(s).   Specialties: Orthopedic Surgery, Emergency Medicine Contact information: [address] [place] [address] [phone]  Signed: Debby JAYSON Amber 02/01/2024, 3:52 PM

## 2024-02-01 NOTE — Interval H&P Note (Signed)
 Patient history and physical updated. Consent reviewed including risks, benefits, and alternatives to surgery. Patient and family agree with above plan to proceed with left total knee arthroplasty.

## 2024-02-01 NOTE — Anesthesia Postprocedure Evaluation (Signed)
 Anesthesia Post Note  Patient: HERSHEL CORKERY  Procedure(s) Performed: ARTHROPLASTY, KNEE, TOTAL (Left: Knee)  Patient location during evaluation: PACU Anesthesia Type: General Level of consciousness: awake and alert Pain management: pain level controlled Vital Signs Assessment: post-procedure vital signs reviewed and stable Respiratory status: spontaneous breathing, nonlabored ventilation and respiratory function stable Cardiovascular status: blood pressure returned to baseline and stable Postop Assessment: no apparent nausea or vomiting Anesthetic complications: no   There were no known notable events for this encounter.   Last Vitals:  Vitals:   02/01/24 1415 02/01/24 1431  BP: 135/76 135/76  Pulse: 69 69  Resp: (!) 9 (!) 9  Temp:  (!) 36.4 C  SpO2: 95%     Last Pain:  Vitals:   02/01/24 1431  TempSrc: Temporal  PainSc:                  Fairy POUR Taaliyah Delpriore

## 2024-02-02 ENCOUNTER — Other Ambulatory Visit: Payer: Self-pay

## 2024-02-02 ENCOUNTER — Encounter: Payer: Self-pay | Admitting: Orthopedic Surgery

## 2024-02-02 DIAGNOSIS — M1712 Unilateral primary osteoarthritis, left knee: Secondary | ICD-10-CM | POA: Diagnosis not present

## 2024-02-02 LAB — CBC
HCT: 37.3 % — ABNORMAL LOW (ref 39.0–52.0)
Hemoglobin: 13.4 g/dL (ref 13.0–17.0)
MCH: 32.8 pg (ref 26.0–34.0)
MCHC: 35.9 g/dL (ref 30.0–36.0)
MCV: 91.2 fL (ref 80.0–100.0)
Platelets: 145 K/uL — ABNORMAL LOW (ref 150–400)
RBC: 4.09 MIL/uL — ABNORMAL LOW (ref 4.22–5.81)
RDW: 12 % (ref 11.5–15.5)
WBC: 8.3 K/uL (ref 4.0–10.5)
nRBC: 0 % (ref 0.0–0.2)

## 2024-02-02 LAB — BASIC METABOLIC PANEL WITH GFR
Anion gap: 6 (ref 5–15)
BUN: 12 mg/dL (ref 8–23)
CO2: 26 mmol/L (ref 22–32)
Calcium: 8.4 mg/dL — ABNORMAL LOW (ref 8.9–10.3)
Chloride: 101 mmol/L (ref 98–111)
Creatinine, Ser: 0.71 mg/dL (ref 0.61–1.24)
GFR, Estimated: >60 mL/min (ref >60)
Glucose, Bld: 125 mg/dL — ABNORMAL HIGH (ref 70–99)
Potassium: 4.1 mmol/L (ref 3.5–5.1)
Sodium: 133 mmol/L — ABNORMAL LOW (ref 135–145)

## 2024-02-02 NOTE — Plan of Care (Signed)
  Problem: Activity: Goal: Ability to avoid complications of mobility impairment will improve Outcome: Progressing Goal: Range of joint motion will improve Outcome: Progressing   Problem: Pain Management: Goal: Pain level will decrease with appropriate interventions Outcome: Progressing   

## 2024-02-02 NOTE — Progress Notes (Signed)
 Physical Therapy Treatment Patient Details Name: Vincent Patton MRN: [id number] DOB: [birth year] Today's Date: 02/02/2024   History of Present Illness Pt is a 73 y.o. male s/p L TKA d/t L knee OA 02/01/24.  PMH includes Raynaud phenomenon, h/o SCC, h/o prostate CA, B hernia repair, Barrett's esophagus, sleep apnea, htn, dementia, actinic keratosis, bradycardia, CAD, dizziness, DOE, R carpal tunnel release.    PT Comments  Pt ready for session.  Wife in and educated throughout on ex, safety and expectations post op.  He is able to walk to/from stairs with RW and cga/min a x 1and complete stair training.  Most assist is for cues for sequencing and safety but overall does well.  Excellent ROM with limited discomfort 0-100.     If plan is discharge home, recommend the following: A little help with walking and/or transfers;A little help with bathing/dressing/bathroom;Assistance with cooking/housework;Assist for transportation;Help with stairs or ramp for entrance;Supervision due to cognitive status   Can travel by private vehicle        Equipment Recommendations  Rolling walker (2 wheels);BSC/3in1    Recommendations for Other Services       Precautions / Restrictions Precautions Precautions: Knee;Fall Precaution Booklet Issued: Yes (comment) Recall of Precautions/Restrictions: Impaired Restrictions Weight Bearing Restrictions Per Provider Order: Yes LLE Weight Bearing Per Provider Order: Weight bearing as tolerated     Mobility  Bed Mobility Overal bed mobility: Modified Independent               Patient Response: Cooperative  Transfers Overall transfer level: Needs assistance Equipment used: Rolling walker (2 wheels) Transfers: Sit to/from Stand Sit to Stand: Min assist           General transfer comment: vc's for UE/LE positioning and overall technique    Ambulation/Gait Ambulation/Gait assistance: Min assist, Contact guard assist Gait Distance (Feet): 200  Feet Assistive device: Rolling walker (2 wheels) Gait Pattern/deviations: Decreased stance time - left Gait velocity: decreased     General Gait Details: generally steady.  mostly cues for walker use.   Stairs Stairs: Yes Stairs assistance: Contact guard assist Stair Management: One rail Right, Step to pattern, Forwards Number of Stairs: 4     Wheelchair Mobility     Tilt Bed Tilt Bed Patient Response: Cooperative  Modified Rankin (Stroke Patients Only)       Balance Overall balance assessment: Needs assistance Sitting-balance support: No upper extremity supported, Feet supported Sitting balance-Leahy Scale: Good     Standing balance support: Bilateral upper extremity supported, Reliant on assistive device for balance Standing balance-Leahy Scale: Fair                              Hotel manager: No apparent difficulties  Cognition Arousal: Alert Behavior During Therapy: WFL for tasks assessed/performed   PT - Cognitive impairments: History of cognitive impairments                       PT - Cognition Comments: Pt reports mild diagnosis of dementia Following commands: Impaired Following commands impaired: Follows one step commands inconsistently, Follows one step commands with increased time    Cueing Cueing Techniques: Verbal cues, Tactile cues, Visual cues  Exercises Total Joint Exercises Goniometric ROM: 0-100 Other Exercises Other Exercises: LLE TKR HEP  x 10    General Comments        Pertinent Vitals/Pain Pain Assessment Pain Assessment: Faces Faces Pain Scale:  Hurts a little bit Pain Location: L knee with mobility Pain Descriptors / Indicators: Sore Pain Intervention(s): Limited activity within patient's tolerance, Monitored during session, Premedicated before session, Repositioned, Ice applied    Home Living                          Prior Function            PT Goals  (current goals can now be found in the care plan section) Progress towards PT goals: Progressing toward goals    Frequency    BID      PT Plan      Co-evaluation              AM-PAC PT 6 Clicks Mobility   Outcome Measure  Help needed turning from your back to your side while in a flat bed without using bedrails?: None Help needed moving from lying on your back to sitting on the side of a flat bed without using bedrails?: None Help needed moving to and from a bed to a chair (including a wheelchair)?: A Little Help needed standing up from a chair using your arms (e.g., wheelchair or bedside chair)?: A Little Help needed to walk in hospital room?: A Little Help needed climbing 3-5 steps with a railing? : A Little 6 Click Score: 20    End of Session Equipment Utilized During Treatment: Gait belt Activity Tolerance: Patient tolerated treatment well Patient left: in bed;with family/visitor present;with call bell/phone within reach Nurse Communication: Mobility status;Precautions;Weight bearing status;Other (comment) PT Visit Diagnosis: Unsteadiness on feet (R26.81);Other abnormalities of gait and mobility (R26.89);Muscle weakness (generalized) (M62.81);Pain Pain - Right/Left: Left Pain - part of body: Knee     Time: 9175-9154 PT Time Calculation (min) (ACUTE ONLY): 21 min  Charges:    $Gait Training: 8-22 mins PT General Charges $$ ACUTE PT VISIT: 1 Visit                   Lauraine Gills, PTA 02/02/24, 8:56 AM

## 2024-02-02 NOTE — Progress Notes (Signed)
 Subjective: 1 Day Post-Op Procedure(s) (LRB): ARTHROPLASTY, KNEE, TOTAL (Left) Patient reports pain as mild.   Patient is well, and has had no acute complaints or problems Denies any CP, SOB, ABD pain. We will continue therapy today.  Plan is to go Home after hospital stay.  Objective: Vital signs in last 24 hours: Temp:  [97.4 F (36.3 C)-98.6 F (37 C)] 97.9 F (36.6 C) (09/16 0603) Pulse Rate:  [59-73] 59 (09/16 0603) Resp:  [8-19] 16 (09/16 0603) BP: (119-169)/(63-94) 119/72 (09/16 0603) SpO2:  [94 %-100 %] 98 % (09/16 0603) Weight:  [78.6 kg] 78.6 kg (09/15 1431)  Intake/Output from previous day: 09/15 0701 - 09/16 0700 In: 1856.1 [P.O.:240; I.V.:1214; IV Piggyback:402.1] Out: 825 [Urine:775; Blood:50] Intake/Output this shift: No intake/output data recorded.  Recent Labs    02/02/24 0618  HGB 13.4   Recent Labs    02/02/24 0618  WBC 8.3  RBC 4.09*  HCT 37.3*  PLT 145*   Recent Labs    02/02/24 0618  NA 133*  K 4.1  CL 101  CO2 26  BUN 12  CREATININE 0.71  GLUCOSE 125*  CALCIUM 8.4*   No results for input(s): LABPT, INR in the last 72 hours.  EXAM General - Patient is Alert, Appropriate, and Oriented Extremity - Neurovascular intact Sensation intact distally Intact pulses distally Dorsiflexion/Plantar flexion intact Dressing - dressing C/D/I and no drainage Motor Function - intact, moving foot and toes well on exam.   Past Medical History:  Diagnosis Date   Actinic keratosis    Aortic atherosclerosis (HCC)    Barrett's esophagus without dysplasia    Bilateral hydrocele    Bilateral leg numbness    Bradycardia    CAD (coronary artery disease)    Cerebral microvascular disease    Cognitive impairment    a.) on NMDA receptor antagonist (memantine )   Diastolic dysfunction    Dizziness    DOE (dyspnea on exertion)    Erectile dysfunction    a.) on PDE5i (sildenafil)   Erosive esophagitis    Genital herpes    GERD  (gastroesophageal reflux disease)    Glaucoma    H/O bilateral inguinal hernia repair    Hepatic cyst    Hiatal hernia    History of prostate cancer s/p prostatectomy 2010   Hx of adenomatous colonic polyps    Hyperlipidemia, mixed    Hypertension, essential    Long-term use of aspirin  therapy    Lumbar facet arthropathy    Lumbar spondylosis    a.) s/p RIGHT far lateral metrx hemilaminectomy and discectomy (L4-L5) 11/24/2018   Meningioma (HCC)    Multiple pulmonary nodules determined by computed tomography of lung    Nephrolithiasis    OSA on CPAP    Pedal edema    Primary osteoarthritis of left knee    Pulmonary hypertension (HCC)    Raynaud phenomenon    Renal cyst, left    Squamous cell skin cancer     Assessment/Plan:   1 Day Post-Op Procedure(s) (LRB): ARTHROPLASTY, KNEE, TOTAL (Left) Principal Problem:   S/P TKR (total knee replacement), left  Estimated body mass index is 23.49 kg/m as calculated from the following:   Height as of this encounter: 6' (1.829 m).   Weight as of this encounter: 78.6 kg. Advance diet Up with therapy Pain well controlled Labs and VSS CM to assist with discharge to home with HHPT today  DVT Prophylaxis - Lovenox , TED hose, and SCDs Weight-Bearing as tolerated  to left leg   T. Medford Amber, PA-C [facility] 02/02/2024, 8:17 AM

## 2024-02-02 NOTE — Evaluation (Signed)
 Occupational Therapy Evaluation Patient Details Name: Vincent Patton MRN: [id number] DOB: [birth year] Today's Date: 02/02/2024   History of Present Illness   Pt is a 73 y.o. male s/p L TKA d/t L knee OA 02/01/24.  PMH includes Raynaud phenomenon, h/o SCC, h/o prostate CA, B hernia repair, Barrett's esophagus, sleep apnea, htn, dementia, actinic keratosis, bradycardia, CAD, dizziness, DOE, R carpal tunnel release.     Clinical Impressions Pt seen for OT evaluation this date, POD#1 from above surgery. Pt was independent in all ADLs & mobility prior to  surgery, spouse able to provide 24/7 supervision and assist at discharge. Pt is eager to return to PLOF with less pain and improved safety and independence. Pt currently requires setup-CGA assist for LB dressing while in seated position due to pain and limited AROM of L knee. Pt able to complete mobility t/f bathroom using RW for toilet transfer at supervision level, verbal cues required for safety / sequencing throughout (pt with decreased STM and cognitive deficits 2/2 hx of dementia). Pt & spouse instructed in polar care mgt, falls prevention strategies, home/routines modifications, DME/AE for LB bathing and dressing tasks, and compression stocking mgt. Pt would benefit from skilled OT services including additional instruction in dressing techniques with or without assistive devices for dressing and bathing skills to support recall and carryover prior to discharge and ultimately to maximize safety, independence, and minimize falls risk and caregiver burden. Do not currently anticipate any OT needs following this hospitalization.        If plan is discharge home, recommend the following:   A little help with walking and/or transfers;A little help with bathing/dressing/bathroom;Supervision due to cognitive status;Help with stairs or ramp for entrance;Assist for transportation;Direct supervision/assist for financial management;Direct supervision/assist for  medications management     Functional Status Assessment   Patient has had a recent decline in their functional status and demonstrates the ability to make significant improvements in function in a reasonable and predictable amount of time.     Equipment Recommendations   BSC/3in1      Precautions/Restrictions   Precautions Precautions: Knee;Fall Precaution Booklet Issued: Yes (comment) Recall of Precautions/Restrictions: Impaired Restrictions Weight Bearing Restrictions Per Provider Order: Yes LLE Weight Bearing Per Provider Order: Weight bearing as tolerated     Mobility Bed Mobility Overal bed mobility: Modified Independent                  Transfers Overall transfer level: Needs assistance Equipment used: Rolling walker (2 wheels) Transfers: Sit to/from Stand Sit to Stand: Contact guard assist, Supervision           General transfer comment: verbal cues for hand placement and LLE positioning for safety, pt able to return demo 5x STS using RW with good recall after education and training. spouse providing verbal cues in addition to OT.      Balance Overall balance assessment: Needs assistance Sitting-balance support: No upper extremity supported, Feet supported Sitting balance-Leahy Scale: Good Sitting balance - Comments: steady reaching within BOS   Standing balance support: Bilateral upper extremity supported, Reliant on assistive device for balance Standing balance-Leahy Scale: Fair Standing balance comment: steady static standing with B UE support on RW                           ADL either performed or assessed with clinical judgement   ADL Overall ADL's : Needs assistance/impaired  Upper Body Dressing : Sitting;Set up Upper Body Dressing Details (indicate cue type and reason): doff gown, don shirt Lower Body Dressing: Set up;Cueing for safety;Cueing for sequencing;Sit to/from stand Lower Body Dressing  Details (indicate cue type and reason): cues for safety (pt attempting to stand on TED hose without proper footwear), setup to don socks, shoes and shorts from STS using RW Toilet Transfer: Supervision/safety;Cueing for sequencing;Cueing for safety;Ambulation;Regular Toilet;Grab bars Toilet Transfer Details (indicate cue type and reason): return demo of toilet transfer, supervision with cues for LLE mgmt and hand placement. discussed techniques for at home Toileting- Clothing Manipulation and Hygiene: Supervision/safety;Sit to/from stand       Functional mobility during ADLs: Contact guard assist;Supervision/safety;Cueing for sequencing;Cueing for safety;Rolling walker (2 wheels) General ADL Comments: t/f bathroom using RW     Vision Baseline Vision/History: 1 Wears glasses Ability to See in Adequate Light: 0 Adequate Patient Visual Report: No change from baseline              Pertinent Vitals/Pain Pain Assessment Pain Assessment: 0-10 Pain Score: 1  Pain Location: L knee with mobility Pain Descriptors / Indicators: Sore Pain Intervention(s): Limited activity within patient's tolerance, Monitored during session, Repositioned, Ice applied     Extremity/Trunk Assessment Upper Extremity Assessment Upper Extremity Assessment: Right hand dominant;Overall [facility] for tasks assessed   Lower Extremity Assessment Lower Extremity Assessment: Defer to PT evaluation   Cervical / Trunk Assessment Cervical / Trunk Assessment: Normal   Communication Communication Communication: No apparent difficulties   Cognition Arousal: Alert Behavior During Therapy: WFL for tasks assessed/performed Cognition: History of cognitive impairments             OT - Cognition Comments: hx of dementia. pt pleasant, STM deficits with decreased problem-solving, cues for safety awareness                 Following commands: Impaired Following commands impaired: Follows one step commands  inconsistently, Follows one step commands with increased time     Cueing  General Comments   Cueing Techniques: Verbal cues;Tactile cues;Visual cues  bilateral TED hose donned upon arrival           Home Living Family/patient expects to be discharged to:: Private residence Living Arrangements: Spouse/significant other Available Help at Discharge: Family;Available 24 hours/day Type of Home: House Home Access: Stairs to enter [employer] of Steps: 4 Entrance Stairs-Rails: Right Home Layout: One level     Bathroom Shower/Tub: Producer, television/film/video: Handicapped height Bathroom Accessibility: Yes How Accessible: Accessible via walker Home Equipment: Grab bars - tub/shower;Toilet riser;Shower seat - built Charity fundraiser (2 wheels);BSC/3in1          Prior Functioning/Environment Prior Level of Function : Needs assist  Cognitive Assist : Mobility (cognitive);ADLs (cognitive) Mobility (Cognitive): Intermittent cues ADLs (Cognitive): Intermittent cues       Mobility Comments: Independent with ambulation.Cues due to dementia. ADLs Comments: independent, cues due to dementia    OT Problem List: Decreased strength;Decreased range of motion;Decreased activity tolerance;Impaired balance (sitting and/or standing);Decreased cognition;Decreased safety awareness;Decreased knowledge of use of DME or AE;Decreased knowledge of precautions;Increased edema   OT Treatment/Interventions: Self-care/ADL training;Therapeutic exercise;Neuromuscular education;Energy conservation;DME and/or AE instruction;Therapeutic activities;Visual/perceptual remediation/compensation;Patient/family education;Balance training      OT Goals(Current goals can be found in the care plan section)   Acute Rehab OT Goals OT Goal Formulation: With patient Time For Goal Achievement: 02/16/24 Potential to Achieve Goals: Good   OT Frequency:  Min 2X/week  AM-PAC OT 6 Clicks  Daily Activity     Outcome Measure Help from another person eating meals?: None Help from another person taking care of personal grooming?: None Help from another person toileting, which includes using toliet, bedpan, or urinal?: A Little Help from another person bathing (including washing, rinsing, drying)?: A Little Help from another person to put on and taking off regular upper body clothing?: A Little Help from another person to put on and taking off regular lower body clothing?: A Little 6 Click Score: 20   End of Session Equipment Utilized During Treatment: Rolling walker (2 wheels) Nurse Communication: Mobility status;Other (comment) (ADL edu complete)  Activity Tolerance: Patient tolerated treatment well Patient left: in bed;with call bell/phone within reach;with family/visitor present  OT Visit Diagnosis: Muscle weakness (generalized) (M62.81);Unsteadiness on feet (R26.81);Other abnormalities of gait and mobility (R26.89)                Time: 9076-9053 OT Time Calculation (min): 23 min Charges:  OT General Charges $OT Visit: 1 Visit OT Evaluation $OT Eval Low Complexity: 1 Low OT Treatments $Self Care/Home Management : 8-22 mins Liborio Saccente L. Crystian Frith, OTR/L  02/02/24, 10:00 AM

## 2024-02-02 NOTE — Progress Notes (Signed)
 Patient is not able to walk the distance required to go the bathroom, or he is unable to safely negotiate stairs required to access the bathroom.  A 3in1 BSC will alleviate this problem.        Lollie Marrow, PA-C [facility]

## 2024-02-02 NOTE — Progress Notes (Signed)
 DISCHARGE NOTE:   Pt dc with IV removed and dc instructions given. Pt received a 3 in 1 to hospital room. Pt also received medications delivered to hospital room. Pt has both TED hose on and in place. Pt given polar care and wheeled down to the medical mall entrance by staff. Pt's wife provided transportation.

## 2024-02-03 ENCOUNTER — Other Ambulatory Visit: Payer: Self-pay

## 2024-02-29 NOTE — Progress Notes (Signed)
 03/01/2024 9:15 AM   Vincent Patton 1951/01/28 [id number]  Referring provider: Glover Lenis, MD [phone] [place],  [place] [address]  Urological history: 1.  Prostate cancer - PSA (04/2023) <0.1 - Prostatectomy (2010)   2.  Erectile dysfunction - Sildenafil 100 mg, on-demand dosing - Vacuum erectile device  3.  Left hydrocele  4.  Nephrolithiasis - CT (08/2023) 2 mm left UVJ stone  5. Left renal cyst - CT (08/2023) 2.3 cm simple left upper pole renal cyst   Chief Complaint  Patient presents with   Prostate Cancer   HPI: Vincent Patton is a 73 y.o. man who presents today for swelling in the left testicle with his wife, [name].   Previous records reviewed.  He has noticed swelling over the past several years .  He reports a feeling of heaviness and uncomfortableness in that left groin.  He feels that it has gotten bigger over the last few months.  Denies fever, chills, dysuria, hematuria, or trauma.  No prior history of scrotal surgery or malignancy.  He has been noticing an increase in nocturia and night sweats.  He has sleep apnea and does have a CPAP machine, but has not been calibrated recently.  He does not report a lot of daytime frequency.  UA yellow clear, specific area 1.020, pH 6.0, trace blood, trace ketone, 0-5 WBCs, 0-2 RBCs, 0-10 epithelial cells, granular casts present and a few bacteria.    PMH: Past Medical History:  Diagnosis Date   Actinic keratosis    Aortic atherosclerosis    Barrett's esophagus without dysplasia    Bilateral hydrocele    Bilateral leg numbness    Bradycardia    CAD (coronary artery disease)    Cerebral microvascular disease    Cognitive impairment    a.) on NMDA receptor antagonist (memantine )   Diastolic dysfunction    Dizziness    DOE (dyspnea on exertion)    Erectile dysfunction    a.) on PDE5i (sildenafil)   Erosive esophagitis    Genital herpes    GERD (gastroesophageal reflux disease)    Glaucoma    H/O  bilateral inguinal hernia repair    Hepatic cyst    Hiatal hernia    History of prostate cancer s/p prostatectomy 2010   Hx of adenomatous colonic polyps    Hyperlipidemia, mixed    Hypertension, essential    Long-term use of aspirin  therapy    Lumbar facet arthropathy    Lumbar spondylosis    a.) s/p RIGHT far lateral metrx hemilaminectomy and discectomy (L4-L5) 11/24/2018   Meningioma (HCC)    Multiple pulmonary nodules determined by computed tomography of lung    Nephrolithiasis    OSA on CPAP    Pedal edema    Primary osteoarthritis of left knee    Pulmonary hypertension (HCC)    Raynaud phenomenon    Renal cyst, left    Squamous cell skin cancer     Surgical History: Past Surgical History:  Procedure Laterality Date   CARPAL TUNNEL RELEASE Right    ESOPHAGOGASTRODUODENOSCOPY     INGUINAL HERNIA REPAIR Bilateral    knee arthoscopy  Right 1983   torn cartlage   KNEE ARTHROSCOPY Right 1979   torn cartlage   leg skin lesion biopsy / lesion  10/2004   squamous cell carcinoma   PROSTATECTOMY  06/2008   RIGHT FAR LATERAL METRX HEMILAMINECTOMY AND DISCECTOMY Right 11/24/2018   Procedure: RIGHT FAR LATERAL METRX HEMILAMINECTOMY AND  DISCECTOMY; Location: [facility]   tear ducts surgery      as a child   TONSILLECTOMY  1960   TOTAL KNEE ARTHROPLASTY Left 02/01/2024   Procedure: ARTHROPLASTY, KNEE, TOTAL;  Surgeon: Lorelle Hussar, MD;  Location: [facility];  Service: Orthopedics;  Laterality: Left;    Home Medications:  Allergies as of 03/01/2024       Reactions   Lodine [etodolac] Swelling   He says tongue swelled up   Tamsulosin Other (See Comments), Shortness Of Breath   Feels like he will pass out while on it with SOB        Medication List        Accurate as of March 01, 2024 11:59 PM. If you have any questions, ask your nurse or doctor.          STOP taking these medications    enoxaparin  40 MG/0.4ML injection Commonly known as:  LOVENOX  Stopped by: Abdulloh Ullom   oxyCODONE  5 MG immediate release tablet Commonly known as: Oxy IR/ROXICODONE  Stopped by: Sakinah Rosamond       TAKE these medications    Acetaminophen  Extra Strength 500 MG Tabs Take 2 tablets (1,000 mg total) by mouth every 8 (eight) hours.   amLODipine  5 MG tablet Commonly known as: NORVASC  Take 5 mg by mouth daily.   aspirin  81 MG chewable tablet Chew 81 mg by mouth daily.   cholecalciferol 25 MCG (1000 UNIT) tablet Commonly known as: VITAMIN D3 Take 1,000 Units by mouth daily.   cyanocobalamin 1000 MCG tablet Commonly known as: VITAMIN B12 Take 1,000 mcg by mouth daily.   docusate sodium  100 MG capsule Commonly known as: COLACE Take 1 capsule (100 mg total) by mouth 2 (two) times daily.   memantine  5 MG tablet Commonly known as: NAMENDA  Take 2.5 mg by mouth 2 (two) times daily.   ondansetron  4 MG tablet Commonly known as: ZOFRAN  Take 1 tablet (4 mg total) by mouth every 6 (six) hours as needed for nausea.   pantoprazole  40 MG tablet Commonly known as: PROTONIX  Take 40 mg by mouth daily.   Potassium 99 MG Tabs Take 99 mg by mouth daily.   sildenafil 20 MG tablet Commonly known as: REVATIO TAKE 2-5 TABLETS BY MOUTH ONCE DAILY AS NEEDED FOR SEXUAL ACTIVITY   timolol  0.5 % ophthalmic solution Commonly known as: TIMOPTIC  Place 1 drop into both eyes 2 (two) times daily.        Allergies:  Allergies  Allergen Reactions   Lodine [Etodolac] Swelling    He says tongue swelled up   Tamsulosin Other (See Comments) and Shortness Of Breath    Feels like he will pass out while on it with SOB    Family History: History reviewed. No pertinent family history.  Social History:  reports that he has quit smoking. His smoking use included cigarettes. He has never used smokeless tobacco. He reports current alcohol use. He reports that he does not use drugs.  ROS: Pertinent ROS in HPI  Physical Exam: BP (!) 156/91 (BP  Location: Left Arm, Patient Position: Sitting, Cuff Size: Normal)   Pulse 75   Ht 6' (1.829 m)   Wt 175 lb 3.2 oz (79.5 kg)   SpO2 98%   BMI 23.76 kg/m   Constitutional:  Well nourished. Alert and oriented, No acute distress. HEENT: [place] AT, moist mucus membranes.  Trachea midline Cardiovascular: No clubbing, cyanosis, or edema. Respiratory: Normal respiratory effort, no increased work of breathing. GU: No CVA  tenderness.  No bladder fullness or masses.  Normal phallus. Urethral meatus is patent.  No penile discharge. No penile lesions or rashes. Scrotum without lesions, cysts, rashes and/or edema.  I could not palpate the left testicle or epididymis secondary to the large left hydrocele.  Right testicle was located scrotal he and no masses or tenderness was appreciated.  Right epididymis was normal.   Neurologic: Grossly intact, no focal deficits, moving all 4 extremities. Psychiatric: Normal mood and affect.  Laboratory Data: See Epic and HPI   I have reviewed the labs.  See HPI.     Pertinent Imaging: N/A  Assessment & Plan:    1. Left testicular swelling - UA bland  - When he saw Dr. Twylla in January they did discuss hydrocelectomy, but he was not ready at that time.  He now wants to proceed with the procedure. - Since he states there has been significant enlargement of the hydrocele since January, I will go ahead and order a scrotal ultrasound for further evaluation of the internal contents - He will return to discuss scrotal ultrasound results and to prepare for hydrocelectomy - We will also obtain a testosterone result at that appointment to rule out hypogonadism as a cause of his night sweats  Return in about 1 month (around 04/01/2024) for Scrotal ultrasound report and serum testosterone level.  These notes generated with voice recognition software. I apologize for typographical errors.  CLOTILDA HELON RIGGERS  [facility] [address]   [address] [place], [place] [address] [phone]

## 2024-03-01 ENCOUNTER — Encounter: Payer: Self-pay | Admitting: Urology

## 2024-03-01 ENCOUNTER — Ambulatory Visit (INDEPENDENT_AMBULATORY_CARE_PROVIDER_SITE_OTHER): Admitting: Urology

## 2024-03-01 VITALS — BP 156/91 | HR 75 | Ht 72.0 in | Wt 175.2 lb

## 2024-03-01 DIAGNOSIS — N433 Hydrocele, unspecified: Secondary | ICD-10-CM

## 2024-03-01 DIAGNOSIS — Z8546 Personal history of malignant neoplasm of prostate: Secondary | ICD-10-CM | POA: Diagnosis not present

## 2024-03-01 DIAGNOSIS — C61 Malignant neoplasm of prostate: Secondary | ICD-10-CM

## 2024-03-01 LAB — URINALYSIS, COMPLETE
Bilirubin, UA: NEGATIVE
Glucose, UA: NEGATIVE
Leukocytes,UA: NEGATIVE
Nitrite, UA: NEGATIVE
Protein,UA: NEGATIVE
Specific Gravity, UA: 1.02 (ref 1.005–1.030)
Urobilinogen, Ur: 0.2 mg/dL (ref 0.2–1.0)
pH, UA: 6 (ref 5.0–7.5)

## 2024-03-01 LAB — MICROSCOPIC EXAMINATION

## 2024-03-01 NOTE — Patient Instructions (Signed)
 Please contact Central Scheduling to set up your ultrasound at [phone].

## 2024-03-08 ENCOUNTER — Ambulatory Visit
Admission: RE | Admit: 2024-03-08 | Discharge: 2024-03-08 | Disposition: A | Discharge status: Home / Self Care | Source: Ambulatory Visit | Attending: Urology | Admitting: Urology

## 2024-03-08 DIAGNOSIS — N433 Hydrocele, unspecified: Secondary | ICD-10-CM | POA: Insufficient documentation

## 2024-03-15 ENCOUNTER — Other Ambulatory Visit: Payer: Self-pay

## 2024-03-15 DIAGNOSIS — N5201 Erectile dysfunction due to arterial insufficiency: Secondary | ICD-10-CM

## 2024-03-17 ENCOUNTER — Other Ambulatory Visit

## 2024-03-17 DIAGNOSIS — N5201 Erectile dysfunction due to arterial insufficiency: Secondary | ICD-10-CM

## 2024-03-18 LAB — TESTOSTERONE: Testosterone: 441 ng/dL (ref 264–916)

## 2024-03-21 NOTE — H&P (View-Only) (Signed)
 03/23/2024 1:27 PM   Vincent Patton [birth year] [id number]  Referring provider: Glover Lenis, MD [phone] [place],  [place] [address]  Urological history: 1.  Prostate cancer - PSA (04/2023) <0.1 - Prostatectomy (2010)   2.  Erectile dysfunction - Testosterone level (02/2024) 441 - Sildenafil 100 mg, on-demand dosing - Vacuum erectile device  3.  Left hydrocele  4.  Nephrolithiasis - CT (08/2023) 2 mm left UVJ stone  5. Left renal cyst - CT (08/2023) 2.3 cm simple left upper pole renal cyst   Chief Complaint  Patient presents with   Left hydrocele   HPI: Vincent Patton is a 73 y.o. man who presents today for scrotal ultrasound report with his wife, Vincent Patton.   Previous records reviewed.  Scrotal ultrasound completed March 08, 2024, noted a moderate to large left hydrocele, 3 mm right testicular cyst, a small left epididymal cyst and bilateral testicular microlithiasis.   Testosterone level was within normal limits, so a low testosterone would not be the cause of his night sweats.  He has not noted any change with his hydrocele since his last visit on 03/23/2024.   PMH: Past Medical History:  Diagnosis Date   Actinic keratosis    Aortic atherosclerosis    Barrett's esophagus without dysplasia    Bilateral hydrocele    Bilateral leg numbness    Bradycardia    CAD (coronary artery disease)    Cerebral microvascular disease    Cognitive impairment    a.) on NMDA receptor antagonist (memantine )   Diastolic dysfunction    Dizziness    DOE (dyspnea on exertion)    Erectile dysfunction    a.) on PDE5i (sildenafil)   Erosive esophagitis    Genital herpes    GERD (gastroesophageal reflux disease)    Glaucoma    H/O bilateral inguinal hernia repair    Hepatic cyst    Hiatal hernia    History of prostate cancer s/p prostatectomy 2010   Hx of adenomatous colonic polyps    Hyperlipidemia, mixed    Hypertension, essential    Long-term use of aspirin   therapy    Lumbar facet arthropathy    Lumbar spondylosis    a.) s/p RIGHT far lateral metrx hemilaminectomy and discectomy (L4-L5) 11/24/2018   Meningioma (HCC)    Multiple pulmonary nodules determined by computed tomography of lung    Nephrolithiasis    OSA on CPAP    Pedal edema    Primary osteoarthritis of left knee    Pulmonary hypertension (HCC)    Raynaud phenomenon    Renal cyst, left    Squamous cell skin cancer     Surgical History: Past Surgical History:  Procedure Laterality Date   CARPAL TUNNEL RELEASE Right    ESOPHAGOGASTRODUODENOSCOPY     INGUINAL HERNIA REPAIR Bilateral    knee arthoscopy  Right 1983   torn cartlage   KNEE ARTHROSCOPY Right 1979   torn cartlage   leg skin lesion biopsy / lesion  10/2004   squamous cell carcinoma   PROSTATECTOMY  06/2008   RIGHT FAR LATERAL METRX HEMILAMINECTOMY AND DISCECTOMY Right 11/24/2018   Procedure: RIGHT FAR LATERAL METRX HEMILAMINECTOMY AND DISCECTOMY; Location: [facility]   tear ducts surgery      as a child   TONSILLECTOMY  1960   TOTAL KNEE ARTHROPLASTY Left 02/01/2024   Procedure: ARTHROPLASTY, KNEE, TOTAL;  Surgeon: Lorelle Hussar, MD;  Location: [facility] ORS;  Service: Orthopedics;  Laterality: Left;  Home Medications:  Allergies as of 03/23/2024       Reactions   Lodine [etodolac] Swelling   He says tongue swelled up   Tamsulosin Other (See Comments), Shortness Of Breath   Feels like he will pass out while on it with SOB        Medication List        Accurate as of March 23, 2024 11:59 PM. If you have any questions, ask your nurse or doctor.          Acetaminophen  Extra Strength 500 MG Tabs Take 2 tablets (1,000 mg total) by mouth every 8 (eight) hours.   amLODipine  5 MG tablet Commonly known as: NORVASC  Take 5 mg by mouth daily.   aspirin  81 MG chewable tablet Chew 81 mg by mouth daily.   cholecalciferol 25 MCG (1000 UNIT) tablet Commonly known as: VITAMIN D3 Take 1,000  Units by mouth daily.   cyanocobalamin 1000 MCG tablet Commonly known as: VITAMIN B12 Take 1,000 mcg by mouth daily.   docusate sodium  100 MG capsule Commonly known as: COLACE Take 1 capsule (100 mg total) by mouth 2 (two) times daily.   memantine  5 MG tablet Commonly known as: NAMENDA  Take 2.5 mg by mouth 2 (two) times daily.   ondansetron  4 MG tablet Commonly known as: ZOFRAN  Take 1 tablet (4 mg total) by mouth every 6 (six) hours as needed for nausea.   pantoprazole  40 MG tablet Commonly known as: PROTONIX  Take 40 mg by mouth daily.   Potassium 99 MG Tabs Take 99 mg by mouth daily.   sildenafil 20 MG tablet Commonly known as: REVATIO TAKE 2-5 TABLETS BY MOUTH ONCE DAILY AS NEEDED FOR SEXUAL ACTIVITY   timolol  0.5 % ophthalmic solution Commonly known as: TIMOPTIC  Place 1 drop into both eyes 2 (two) times daily.        Allergies:  Allergies  Allergen Reactions   Lodine [Etodolac] Swelling    He says tongue swelled up   Tamsulosin Other (See Comments) and Shortness Of Breath    Feels like he will pass out while on it with SOB    Family History: History reviewed. No pertinent family history.  Social History:  reports that he has quit smoking. His smoking use included cigarettes. He has never used smokeless tobacco. He reports current alcohol use. He reports that he does not use drugs.  ROS: Pertinent ROS in HPI  Physical Exam: BP (!) 146/82   Pulse 67   Ht 6' (1.829 m)   Wt 178 lb (80.7 kg)   SpO2 97%   BMI 24.14 kg/m   Constitutional:  Well nourished. Alert and oriented, No acute distress. HEENT: [place] AT, moist mucus membranes.  Trachea midline Cardiovascular: No clubbing, cyanosis, or edema. Respiratory: Normal respiratory effort, no increased work of breathing. GU: No CVA tenderness.  No bladder fullness or masses.  Normal phallus. Urethral meatus is patent.  No penile discharge. No penile lesions or rashes. Scrotum without lesions, cysts, rashes  and/or edema.  I could not palpate the left testicle or epididymis secondary to the large left hydrocele.  Right testicle was located scrotal he and no masses or tenderness was appreciated.  Right epididymis was normal.   Neurologic: Grossly intact, no focal deficits, moving all 4 extremities. Psychiatric: Normal mood and affect.  Laboratory Data: See Epic and HPI   I have reviewed the labs.  See HPI.     Pertinent Imaging: N/A  Assessment & Plan:  1. Left testicular swelling - Scrotal ultrasound confirms that is a large left hydrocele - He would like to pursue left hydrocelectomy at this time - Discussed risks: bleeding, infection, injury to testicle or cord structures, recurrence ~6% - Patient understands and consents to proceed.  Return for Left hydrocelectomy .  These notes generated with voice recognition software. I apologize for typographical errors.  CLOTILDA HELON RIGGERS  [facility] [address]  [address] [place], [place] [address] [phone]   Surgical Physician Order Form [facility]  * Scheduling expectation : Next Available w/ Dr. Twylla  *Length of Case:   *Clearance needed: yes  *Anticoagulation Instructions: Hold all anticoagulants  *Aspirin  Instructions: Hold Aspirin   *Post-op visit Date/Instructions:  1 month follow up  *Diagnosis: Hydrocele  *Procedure: left Hydrocele excision groin/unilateral scrotal approach (44959)   Additional orders: N/A  -Admit type: OUTpatient  -Anesthesia: General  -VTE Prophylaxis Standing Order SCD's       Other:   -Standing Lab Orders Per Anesthesia    Lab other: None  -Standing Test orders EKG/Chest x-ray per Anesthesia       Test other:   - Medications:  Ancef  2gm IV  -Other orders:  N/A

## 2024-03-21 NOTE — Progress Notes (Signed)
 03/23/2024 1:27 PM   Vincent Patton [birth year] [id number]  Referring provider: Glover Lenis, MD [phone] [place],  [place] [address]  Urological history: 1.  Prostate cancer - PSA (04/2023) <0.1 - Prostatectomy (2010)   2.  Erectile dysfunction - Testosterone level (02/2024) 441 - Sildenafil 100 mg, on-demand dosing - Vacuum erectile device  3.  Left hydrocele  4.  Nephrolithiasis - CT (08/2023) 2 mm left UVJ stone  5. Left renal cyst - CT (08/2023) 2.3 cm simple left upper pole renal cyst   Chief Complaint  Patient presents with   Left hydrocele   HPI: Vincent Patton is a 73 y.o. man who presents today for scrotal ultrasound report with his wife, Vincent Patton.   Previous records reviewed.  Scrotal ultrasound completed March 08, 2024, noted a moderate to large left hydrocele, 3 mm right testicular cyst, a small left epididymal cyst and bilateral testicular microlithiasis.   Testosterone level was within normal limits, so a low testosterone would not be the cause of his night sweats.  He has not noted any change with his hydrocele since his last visit on 03/23/2024.   PMH: Past Medical History:  Diagnosis Date   Actinic keratosis    Aortic atherosclerosis    Barrett's esophagus without dysplasia    Bilateral hydrocele    Bilateral leg numbness    Bradycardia    CAD (coronary artery disease)    Cerebral microvascular disease    Cognitive impairment    a.) on NMDA receptor antagonist (memantine )   Diastolic dysfunction    Dizziness    DOE (dyspnea on exertion)    Erectile dysfunction    a.) on PDE5i (sildenafil)   Erosive esophagitis    Genital herpes    GERD (gastroesophageal reflux disease)    Glaucoma    H/O bilateral inguinal hernia repair    Hepatic cyst    Hiatal hernia    History of prostate cancer s/p prostatectomy 2010   Hx of adenomatous colonic polyps    Hyperlipidemia, mixed    Hypertension, essential    Long-term use of aspirin   therapy    Lumbar facet arthropathy    Lumbar spondylosis    a.) s/p RIGHT far lateral metrx hemilaminectomy and discectomy (L4-L5) 11/24/2018   Meningioma (HCC)    Multiple pulmonary nodules determined by computed tomography of lung    Nephrolithiasis    OSA on CPAP    Pedal edema    Primary osteoarthritis of left knee    Pulmonary hypertension (HCC)    Raynaud phenomenon    Renal cyst, left    Squamous cell skin cancer     Surgical History: Past Surgical History:  Procedure Laterality Date   CARPAL TUNNEL RELEASE Right    ESOPHAGOGASTRODUODENOSCOPY     INGUINAL HERNIA REPAIR Bilateral    knee arthoscopy  Right 1983   torn cartlage   KNEE ARTHROSCOPY Right 1979   torn cartlage   leg skin lesion biopsy / lesion  10/2004   squamous cell carcinoma   PROSTATECTOMY  06/2008   RIGHT FAR LATERAL METRX HEMILAMINECTOMY AND DISCECTOMY Right 11/24/2018   Procedure: RIGHT FAR LATERAL METRX HEMILAMINECTOMY AND DISCECTOMY; Location: [facility]   tear ducts surgery      as a child   TONSILLECTOMY  1960   TOTAL KNEE ARTHROPLASTY Left 02/01/2024   Procedure: ARTHROPLASTY, KNEE, TOTAL;  Surgeon: Lorelle Hussar, MD;  Location: [facility];  Service: Orthopedics;  Laterality: Left;  Home Medications:  Allergies as of 03/23/2024       Reactions   Lodine [etodolac] Swelling   He says tongue swelled up   Tamsulosin Other (See Comments), Shortness Of Breath   Feels like he will pass out while on it with SOB        Medication List        Accurate as of March 23, 2024 11:59 PM. If you have any questions, ask your nurse or doctor.          Acetaminophen  Extra Strength 500 MG Tabs Take 2 tablets (1,000 mg total) by mouth every 8 (eight) hours.   amLODipine  5 MG tablet Commonly known as: NORVASC  Take 5 mg by mouth daily.   aspirin  81 MG chewable tablet Chew 81 mg by mouth daily.   cholecalciferol 25 MCG (1000 UNIT) tablet Commonly known as: VITAMIN D3 Take 1,000  Units by mouth daily.   cyanocobalamin 1000 MCG tablet Commonly known as: VITAMIN B12 Take 1,000 mcg by mouth daily.   docusate sodium  100 MG capsule Commonly known as: COLACE Take 1 capsule (100 mg total) by mouth 2 (two) times daily.   memantine  5 MG tablet Commonly known as: NAMENDA  Take 2.5 mg by mouth 2 (two) times daily.   ondansetron  4 MG tablet Commonly known as: ZOFRAN  Take 1 tablet (4 mg total) by mouth every 6 (six) hours as needed for nausea.   pantoprazole  40 MG tablet Commonly known as: PROTONIX  Take 40 mg by mouth daily.   Potassium 99 MG Tabs Take 99 mg by mouth daily.   sildenafil 20 MG tablet Commonly known as: REVATIO TAKE 2-5 TABLETS BY MOUTH ONCE DAILY AS NEEDED FOR SEXUAL ACTIVITY   timolol  0.5 % ophthalmic solution Commonly known as: TIMOPTIC  Place 1 drop into both eyes 2 (two) times daily.        Allergies:  Allergies  Allergen Reactions   Lodine [Etodolac] Swelling    He says tongue swelled up   Tamsulosin Other (See Comments) and Shortness Of Breath    Feels like he will pass out while on it with SOB    Family History: History reviewed. No pertinent family history.  Social History:  reports that he has quit smoking. His smoking use included cigarettes. He has never used smokeless tobacco. He reports current alcohol use. He reports that he does not use drugs.  ROS: Pertinent ROS in HPI  Physical Exam: BP (!) 146/82   Pulse 67   Ht 6' (1.829 m)   Wt 178 lb (80.7 kg)   SpO2 97%   BMI 24.14 kg/m   Constitutional:  Well nourished. Alert and oriented, No acute distress. HEENT: [place] AT, moist mucus membranes.  Trachea midline Cardiovascular: No clubbing, cyanosis, or edema. Respiratory: Normal respiratory effort, no increased work of breathing. GU: No CVA tenderness.  No bladder fullness or masses.  Normal phallus. Urethral meatus is patent.  No penile discharge. No penile lesions or rashes. Scrotum without lesions, cysts, rashes  and/or edema.  I could not palpate the left testicle or epididymis secondary to the large left hydrocele.  Right testicle was located scrotal he and no masses or tenderness was appreciated.  Right epididymis was normal.   Neurologic: Grossly intact, no focal deficits, moving all 4 extremities. Psychiatric: Normal mood and affect.  Laboratory Data: See Epic and HPI   I have reviewed the labs.  See HPI.     Pertinent Imaging: N/A  Assessment & Plan:  1. Left testicular swelling - Scrotal ultrasound confirms that is a large left hydrocele - He would like to pursue left hydrocelectomy at this time - Discussed risks: bleeding, infection, injury to testicle or cord structures, recurrence ~6% - Patient understands and consents to proceed.  Return for Left hydrocelectomy .  These notes generated with voice recognition software. I apologize for typographical errors.  CLOTILDA HELON RIGGERS  [facility] [address]  [address] [place], [place] [address] [phone]   Surgical Physician Order Form [facility]  * Scheduling expectation : Next Available w/ Dr. Twylla  *Length of Case:   *Clearance needed: yes  *Anticoagulation Instructions: Hold all anticoagulants  *Aspirin  Instructions: Hold Aspirin   *Post-op visit Date/Instructions:  1 month follow up  *Diagnosis: Hydrocele  *Procedure: left Hydrocele excision groin/unilateral scrotal approach ([address])   Additional orders: N/A  -Admit type: OUTpatient  -Anesthesia: General  -VTE Prophylaxis Standing Order SCD's       Other:   -Standing Lab Orders Per Anesthesia    Lab other: None  -Standing Test orders EKG/Chest x-ray per Anesthesia       Test other:   - Medications:  Ancef  2gm IV  -Other orders:  N/A

## 2024-03-22 ENCOUNTER — Ambulatory Visit: Admitting: Urology

## 2024-03-23 ENCOUNTER — Other Ambulatory Visit: Payer: Self-pay

## 2024-03-23 ENCOUNTER — Ambulatory Visit: Admitting: Urology

## 2024-03-23 ENCOUNTER — Encounter: Payer: Self-pay | Admitting: Urology

## 2024-03-23 VITALS — BP 146/82 | HR 67 | Ht 72.0 in | Wt 178.0 lb

## 2024-03-23 DIAGNOSIS — N433 Hydrocele, unspecified: Secondary | ICD-10-CM | POA: Diagnosis not present

## 2024-03-23 NOTE — Progress Notes (Unsigned)
 Surgical Physician Order Form [facility]  * Scheduling expectation : Next Available w/ Dr. Twylla  *Length of Case:   *Clearance needed: yes  *Anticoagulation Instructions: Hold all anticoagulants  *Aspirin  Instructions: Hold Aspirin   *Post-op visit Date/Instructions:  1 month follow up  *Diagnosis: Hydrocele  *Procedure: left Hydrocele excision groin/unilateral scrotal approach ([address])   Additional orders: N/A  -Admit type: OUTpatient  -Anesthesia: General  -VTE Prophylaxis Standing Order SCD's       Other:   -Standing Lab Orders Per Anesthesia    Lab other: None  -Standing Test orders EKG/Chest x-ray per Anesthesia       Test other:   - Medications:  Ancef  2gm IV  -Other orders:  N/A

## 2024-03-24 ENCOUNTER — Telehealth: Payer: Self-pay

## 2024-03-24 NOTE — Progress Notes (Signed)
  Phone Number: [phone] for Surgical Coordinator Fax Number: [phone]  REQUEST FOR SURGICAL CLEARANCE      Date: Date: 03/24/24  Faxed to: Dr. Ammon, MD  Surgeon: Dr. Glendia Barba, MD     Date of Surgery: 04/21/2024  Operation: Left Hydrocelectomy   Anesthesia Type: General    Diagnosis: Left Hydrocele  Patient Requires:   Cardiac / Vascular Clearance : Yes  Reason: Would like for patient to hold 81mg  ASA x 5 days prior to surgery.    Risk Assessment:    Low   []       Moderate   []     High   []           This patient is optimized for surgery  YES []       NO   []    I recommend further assessment/workup prior to surgery. YES []      NO  []   Appointment scheduled for: _______________________   Further recommendations: ____________________________________     Physician Signature:__________________________________   Printed Name: ________________________________________   Date: _________________

## 2024-03-24 NOTE — Telephone Encounter (Signed)
 Per Dr. Twylla, Patient is to be scheduled for Left Hydrocelectomy   Vincent Patton was contacted and possible surgical dates were discussed, Thursday December 4th, 2025 was agreed upon for surgery.   Patient was directed to call [phone] between 1-3pm the day before surgery to find out surgical arrival time.  Instructions were given not to eat or drink from midnight on the night before surgery and have a driver for the day of surgery. On the surgery day patient was instructed to enter through the Medical Mall entrance of [facility] report the Same Day Surgery desk.   Pre-Admit Testing will be in contact via phone to set up an interview with the anesthesia team to review your history and medications prior to surgery.   Reminder of this information was sent via MyChart to the patient.

## 2024-03-24 NOTE — Progress Notes (Signed)
   [facility] Surgical Posting Form  Surgery Date: Date: 04/21/2024  Surgeon: Dr. Glendia Barba, MD  Inpt ( No  )   Outpt (Yes)   Obs ( No  )   Diagnosis: N43.3 Left Hydrocele  -CPT: [phone]  Surgery: Left Hydrocelectomy  Stop Anticoagulations: Yes and hold ASA x 5 days  Cardiac/Medical/Pulmonary Clearance needed: Yes  Clearance needed from Dr: Ammon  Clearance request sent on: Date: 03/24/24  *Orders entered into EPIC  Date: 03/24/24   *Case booked in EPIC  Date: 03/24/24  *Notified pt of Surgery: Date: 03/24/24  PRE-OP UA & CX: no  *Placed into Prior Authorization Work [place] Date: 03/24/24  Assistant/laser/rep:No               .

## 2024-04-07 ENCOUNTER — Encounter
Admission: RE | Admit: 2024-04-07 | Discharge: 2024-04-07 | Disposition: A | Discharge status: Home / Self Care | Source: Ambulatory Visit | Attending: Urology | Admitting: Urology

## 2024-04-07 ENCOUNTER — Other Ambulatory Visit: Payer: Self-pay

## 2024-04-07 HISTORY — DX: Hydrocele, unspecified: N43.3

## 2024-04-07 NOTE — Patient Instructions (Addendum)
 Your procedure is scheduled on:04-21-24 Thursday Report to the Registration Desk on the 1st floor of the Medical Mall.Then proceed to the 2nd floor Surgery Desk To find out your arrival time, please call [phone] between 1PM - 3PM on:04-20-24 Wednesday If your arrival time is 6:00 am, do not arrive before that time as the Medical Mall entrance doors do not open until 6:00 am.  REMEMBER: Instructions that are not followed completely may result in serious medical risk, up to and including death; or upon the discretion of your surgeon and anesthesiologist your surgery may need to be rescheduled.  Do not eat food OR drink liquids after midnight the night before surgery.  No gum chewing or hard candies.  One week prior to surgery:Last dose will be on 04-13-24 Wednesday Stop Anti-inflammatories (NSAIDS) such as Advil, Aleve, Ibuprofen, Motrin, Naproxen, Naprosyn and Aspirin  based products such as Excedrin, Goody's Powder, BC Powder. Stop ANY OVER THE COUNTER supplements until after surgery (Vitamin D3, Vitamin B12, Potassium, Magnesium)  You may however, continue to take Tylenol  if needed for pain up until the day of surgery.  Stop sildenafil (REVATIO) 2 days prior to surgery-Last dose will be on 04-18-24 Monday  Stop your 81 mg Aspirin  5 days prior to surgery-Last dose will be on 04-15-24 Friday  Continue taking all of your other prescription medications up until the day of surgery.  ON THE DAY OF SURGERY ONLY TAKE THESE MEDICATIONS WITH SIPS OF WATER: -amLODipine  (NORVASC )  -memantine  (NAMENDA )  -pantoprazole  (PROTONIX )   No Alcohol for 24 hours before or after surgery.  No Smoking including e-cigarettes for 24 hours before surgery.  No chewable tobacco products for at least 6 hours before surgery.  No nicotine patches on the day of surgery.  Do not use any recreational drugs for at least a week (preferably 2 weeks) before your surgery.  Please be advised that the combination of  cocaine and anesthesia may have negative outcomes, up to and including death. If you test positive for cocaine, your surgery will be cancelled.  On the morning of surgery brush your teeth with toothpaste and water, you may rinse your mouth with mouthwash if you wish. Do not swallow any toothpaste or mouthwash.  Do not wear jewelry, make-up, hairpins, clips or nail polish.  For welded (permanent) jewelry: bracelets, anklets, waist bands, etc.  Please have this removed prior to surgery.  If it is not removed, there is a chance that hospital personnel will need to cut it off on the day of surgery.  Do not wear lotions, powders, or perfumes.   Do not shave body hair from the neck down 48 hours before surgery.  Contact lenses, hearing aids and dentures may not be worn into surgery.  Do not bring valuables to the hospital. [facility] is not responsible for any missing/lost belongings or valuables.   Notify your doctor if there is any change in your medical condition (cold, fever, infection).  Wear comfortable clothing (specific to your surgery type) to the hospital.  Bring your C-Pap machine to the hospital  After surgery, you can help prevent lung complications by doing breathing exercises.  Take deep breaths and cough every 1-2 hours. Your doctor may order a device called an Incentive Spirometer to help you take deep breaths. When coughing or sneezing, hold a pillow firmly against your incision with both hands. This is called "splinting." Doing this helps protect your incision. It also decreases belly discomfort.  If you are being admitted to  the hospital overnight, leave your suitcase in the car. After surgery it may be brought to your room.  In case of increased patient census, it may be necessary for you, the patient, to continue your postoperative care in the Same Day Surgery department.  If you are being discharged the day of surgery, you will not be allowed to drive home. You will  need a responsible individual to drive you home and stay with you for 24 hours after surgery.   If you are taking public transportation, you will need to have a responsible individual with you.  Please call the Pre-admissions Testing Dept. at [phone] if you have any questions about these instructions.  Surgery Visitation Policy:  Patients having surgery or a procedure may have two visitors.  Children under the age of 64 must have an adult with them who is not the patient.   Merchandiser, Retail to address health-related social needs:  [email or web address]

## 2024-04-12 NOTE — Progress Notes (Signed)
 Perioperative / Anesthesia Services  Pre-Admission Testing Clinical Review / Pre-Operative Anesthesia Consult  Date: 04/12/24  PATIENT DEMOGRAPHICS: Name: Vincent Patton DOB: 10/15/1950 MRN:   [id number]  Note: Available PAT nursing documentation and vital signs have been reviewed. Clinical nursing staff has updated patient's PMH/PSHx, current medication list, and drug allergies/intolerances to ensure complete and comprehensive history available to assist care teams in MDM as it pertains to the aforementioned surgical procedure and anticipated anesthetic course. Extensive review of available clinical information personally performed. Nursing documentation reviewed. [place] PMH and PSHx updated with any diagnoses and/or procedures that I have knowledge of that may have been inadvertently omitted during his intake with the pre-admission testing department's nursing staff.  PLANNED SURGICAL PROCEDURE(S):   Case: [id number] Date/Time: 04/21/24 0950   Procedure: HYDROCELECTOMY (Left)   Anesthesia type: General   Diagnosis: Left hydrocele [N43.3]   Pre-op diagnosis: Left Hydrocele   Location: [facility] OR ROOM 10 / [facility] ORS FOR ANESTHESIA GROUP   Surgeons: Twylla Glendia BROCKS, MD        CLINICAL DISCUSSION: Vincent Patton is a 73 y.o. male who is submitted for pre-surgical anesthesia review and clearance prior to him undergoing the above procedure.  Patient is a Former Games Developer. Pertinent PMH includes: CAD, diastolic dysfunction, bradycardia, chronic cerebral microvascular disease, aortic atherosclerosis, HTN, HLD, pulmonary hypertension, DOE, COPD, OSAH (requires nocturnal PAP therapy), GERD (on daily PPI), erosive esophagitis, hiatal hernia, Barrett's esophagus, meningioma, glaucoma, nephrolithiasis,  LEFT hydrocele, peripheral edema, OA, lumbar spondylosis (s/p L4-L5 RIGHT hemilaminectomy), cognitive impairment.   Patient is followed by cardiology Andrea, MD). He was last seen in the cardiology  clinic on 12/16/2023; notes reviewed. At the time of his clinic visit, planes of a 56-month history of intermittent exertional chest tightness that was brief in nature and that resolved with rest.  Patient denied any associated shortness of breath, PND, orthopnea, palpitations, significant peripheral edema, weakness, fatigue, vertiginous symptoms, or presyncope/syncope. Patient with a past medical history significant for cardiovascular diagnoses. Documented physical exam was grossly benign, providing no evidence of acute exacerbation and/or decompensation of the patient's known cardiovascular conditions.   Coronary CTA was performed on 08/01/2021 that demonstrated an Agatston coronary artery calcium score of 352. This placed patient in the 66th percentile for age, sex, and race matched controls. Calcium depositions noted to be isolated mainly in the proximal-mid LAD (50%) and proximal RCA/LCx (25%) distributions.  Study demonstrated normal coronary origin with RIGHT dominance.  Study was sent for FFR CT analysis (ranges: < 0.75 high likelihood of hemodynamically significant stenosis, 0.76-0.80 borderline, > 0.80 normal):   Left Main:  No significant stenosis. LAD: No significant stenosis.  FFRct 0.87 LCX: No significant stenosis.  FFRct 0.90 RCA: No significant stenosis.  FFRct 0.86   Myocardial perfusion imaging study performed on 06/03/2023 revealed a normal left ventricular systolic function with an EF of 62%.  There was no evidence of stress-induced myocardial ischemia or arrhythmia; no scintigraphic evidence of scar. TID ratio = 0.95 (normal range </= 1.2).  Study was determined to be normal and low risk.   Most recent TTE performed on 12/23/2023 revealed a normal left ventricular systolic function with an EF of >55 %. There was mild LVH.  There were no regional wall motion abnormalities. Left ventricular diastolic Doppler parameters consistent with pseudonormalization (G2DD). Right ventricular size  and function normal with a TAPSE measuring 2.5 cm  (normal range >/= 1.6 cm).  RVSP = 35.0 mmHg consistent with mild pulmonary hypertension.  There was mild pan valvular regurgitation. All transvalvular gradients were noted to be normal providing no evidence of hemodynamically significant valvular stenosis. Aorta normal in size with no evidence of ectasia or aneurysmal dilatation.  Blood pressure well controlled at 126/80 mmHg on currently prescribed CCB (amlodipine ) monotherapy.  He is not taking any type of lipid-lowering therapies for his HLD diagnosis and ASCVD prevention. In the setting of known cardiovascular diagnoses, it is important note that patient is on a PDE5i medication (sildenafil) for an erectile dysfunction diagnosis.  Patient is not diabetic.  He does have an OSAH diagnosis and is reported to be compliant with prescribed nocturnal PAP therapy. Patient is able to complete all of his  ADL/IADLs without cardiovascular limitation.  Per the DASI, patient is able to achieve at least 4 METS of physical activity without experiencing any significant degree of angina/anginal equivalent symptoms. No changes were made to his medication regimen during his visit with cardiology.  Patient scheduled to follow-up with outpatient cardiology in 6 months or sooner if needed.   [name] is scheduled for an elective HYDROCELECTOMY (Left) on 04/22/2023 with Dr. Glendia JAYSON Barba, MD. Given patient's past medical history significant for cardiovascular diagnoses, presurgical cardiac clearance was sought by the PAT team. Per cardiology, this patient is optimized for surgery and may proceed with the planned procedural course with a LOW risk of significant perioperative cardiovascular complications.   In review of the patient's medication reconciliation, it is noted that he is on daily oral antithrombotic therapy. He has been instructed on recommendations for holding his daily low-dose ASA for 5 days prior to his  procedure with plans to restart as soon as postoperative bleeding risk felt to be minimized by his primary attending surgeon. The patient has been instructed that his last dose should be on 04/15/2024.  Patient denies previous perioperative complications with anesthesia in the past. In review his EMR, it is noted that patient underwent a general anesthetic course here at [facility] (ASA III) in 01/2024 without documented complications.   MOST RECENT VITAL SIGNS:    03/23/2024   10:30 AM 03/01/2024    3:25 PM 02/02/2024   11:00 AM  Vitals with BMI  Height 6' 0 6' 0   Weight 178 lbs 175 lbs 3 oz   BMI 24.14 23.76   Systolic 146 156 870  Diastolic 82 91 74  Pulse 67 75 58   PROVIDERS/SPECIALISTS: NOTE: Primary physician provider listed below. Patient may have been seen by APP or partner within same practice.   PROVIDER ROLE / SPECIALTY LAST SHERLEAN Barba Glendia JAYSON, MD Urology (Surgeon) 03/23/2024  Glover Lenis, MD Primary Care Provider 11/30/2023  Ammon Blunt, MD Cardiology 12/16/2023  Dodson Nest, MD Physiatry 01/05/2024  Maree Hila, MD Neurology 04/11/2024   ALLERGIES: Allergies  Allergen Reactions   Lodine [Etodolac] Swelling    He says tongue swelled up   Tamsulosin Other (See Comments) and Shortness Of Breath    Feels like he will pass out while on it with SOB    CURRENT HOME MEDICATIONS: No current facility-administered medications for this encounter.    acetaminophen  (TYLENOL ) 500 MG tablet   amLODipine  (NORVASC ) 5 MG tablet   aspirin  81 MG chewable tablet   cholecalciferol (VITAMIN D3) 25 MCG (1000 UNIT) tablet   cyanocobalamin (VITAMIN B12) 1000 MCG tablet   docusate sodium  (COLACE) 100 MG capsule   magnesium gluconate (MAGONATE) 500 (27 Mg) MG TABS tablet   memantine  (  NAMENDA ) 5 MG tablet   pantoprazole  (PROTONIX ) 40 MG tablet   Potassium 99 MG TABS   sildenafil (REVATIO) 20 MG tablet   timolol  (TIMOPTIC )  0.5 % ophthalmic solution   HISTORY: Past Medical History:  Diagnosis Date   Actinic keratosis    Aortic atherosclerosis    Barrett's esophagus without dysplasia    Bilateral hydrocele    Bilateral leg numbness    Bradycardia    CAD (coronary artery disease)    Cerebral microvascular disease    Cognitive impairment    a.) on NMDA receptor antagonist (memantine )   Diastolic dysfunction    Dizziness    DOE (dyspnea on exertion)    Erectile dysfunction    a.) on PDE5i (sildenafil)   Erosive esophagitis    Genital herpes    GERD (gastroesophageal reflux disease)    Glaucoma    H/O bilateral inguinal hernia repair    Hepatic cyst    Hiatal hernia    History of prostate cancer s/p prostatectomy 2010   Hx of adenomatous colonic polyps    Hyperlipidemia, mixed    Hypertension, essential    Left hydrocele    Long-term use of aspirin  therapy    Lumbar facet arthropathy    Lumbar spondylosis    a.) s/p RIGHT far lateral metrx hemilaminectomy and discectomy (L4-L5) 11/24/2018   Meningioma (HCC)    Multiple pulmonary nodules determined by computed tomography of lung    Nephrolithiasis    OSA on CPAP    Pedal edema    Primary osteoarthritis of left knee    Pulmonary hypertension (HCC)    Raynaud phenomenon    Renal cyst, left    Squamous cell skin cancer    Past Surgical History:  Procedure Laterality Date   CARPAL TUNNEL RELEASE Right    ESOPHAGOGASTRODUODENOSCOPY     INGUINAL HERNIA REPAIR Bilateral    knee arthoscopy  Right 1983   torn cartlage   KNEE ARTHROSCOPY Right 1979   torn cartlage   leg skin lesion biopsy / lesion  10/2004   squamous cell carcinoma   PROSTATECTOMY  06/2008   RIGHT FAR LATERAL METRX HEMILAMINECTOMY AND DISCECTOMY Right 11/24/2018   Procedure: RIGHT FAR LATERAL METRX HEMILAMINECTOMY AND DISCECTOMY; Location: [facility]   tear ducts surgery      as a child   TONSILLECTOMY  1960   TOTAL KNEE ARTHROPLASTY Left 02/01/2024   Procedure:  ARTHROPLASTY, KNEE, TOTAL;  Surgeon: Lorelle Hussar, MD;  Location: [facility] ORS;  Service: Orthopedics;  Laterality: Left;   No family history on file. Social History   Tobacco Use   Smoking status: Former    Types: Cigarettes   Smokeless tobacco: Never  Substance Use Topics   Alcohol use: Yes    Comment: beer once a month   LABS:  Lab Results  Component Value Date   WBC 8.3 02/02/2024   HGB 13.4 02/02/2024   HCT 37.3 (L) 02/02/2024   MCV 91.2 02/02/2024   PLT 145 (L) 02/02/2024   Lab Results  Component Value Date   NA 133 (L) 02/02/2024   CL 101 02/02/2024   K 4.1 02/02/2024   CO2 26 02/02/2024   BUN 12 02/02/2024   CREATININE 0.71 02/02/2024   GFRNONAA >60 02/02/2024   CALCIUM 8.4 (L) 02/02/2024   ALBUMIN 4.0 01/25/2024   GLUCOSE 125 (H) 02/02/2024    ECG: Date: 12/16/2023  Time ECG obtained: 1543 PM Rate: 54 bpm Rhythm: Sinus bradycardia Axis (leads I and aVF):  normal Intervals: PR 200 ms. QRS 106 ms. QTc 403 ms. ST segment and T wave changes: No evidence of acute T wave abnormalities or significant ST segment elevation or depression.  Evidence of a possible, age undetermined, prior infarct:  Yes; anterior Comparison: Similar to previous tracing obtained on 08/24/2020 NOTE: Tracing obtained at [facility]; unable for review. Above based on cardiologist's interpretation.   IMAGING / PROCEDURES: US  SCROTUM performed on 03/08/2024 Moderate to large left hydrocele. 3 mm right testicular cyst. Small left epididymal cyst. Bilateral testicular microlithiasis.  MR LUMBAR SPINE W WO CONTRAST performed on 12/14/2023 L3-4: Intermediate-sized left asymmetric disc bulge with narrowing of the left lateral recess and moderate left foraminal stenosis. Extraforaminal disc component has been resected L4-5: Unchanged intermediate-sized disc bulge with endplate swelling and severe right and mild left foraminal stenosis.  TRANSTHORACIC ECHOCARDIOGRAM performed on  12/23/2023 Normal left ventricular systolic function with an EF of >55% Mild LVH No regional wall motion abnormalities Left ventricular diastolic Doppler parameters consistent with pseudonormalization (G2DD). Normal right ventricular size and function Mild pan valvular regurgitation RVSP = 35 mmHg  Normal transvalvular gradients; no valvular stenosis   MR LUMBAR SPINE W WO CONTRAST performed on 12/14/2023 L3-4: Intermediate-sized left asymmetric disc bulge with narrowing of the left lateral recess and moderate left foraminal stenosis. Extraforaminal disc component has been resected. L4-5: Unchanged intermediate-sized disc bulge with endplate swelling and severe right and mild left foraminal stenosis.   CT RENAL STONE STUDY performed on 09/13/2023 Small hiatal hernia Subcentimeter LEFT hepatic cyst (benign) 2.3 cm simple LEFT upper pole renal cyst (benign) 2 mm distal LEFT ureteral calculus just above the UVJ with mild LEFT hydronephrosis Status post prostatectomy BILATERAL hydroceles; moderate left and small right Atherosclerotic calcifications of the abdominal aorta and branch vessels   MR BRAIN W WO CONTRAST performed on 07/07/2023 No significant change in the size of 3.7 x 2.2 cm meningioma centered along the right cavernous sinus. Mild chronic microvascular ischemic changes.   DIAGNOSTIC RADIOGRAPHS OF LEFT KNEE 4+ VIEWS performed on 06/26/2023 Complete loss of joint space in the medial compartment of the left knee with varus deformity and sclerotic changes along the medial tibial plateau. Patellofemoral osteoarthritis with normal tracking of the patella in the trochlear groove.   Mid PA flexion view shows no valgus or varus alignment but continued joint space narrowing with complete loss of joint space in the medial compartment.    MYOCARDIAL PERFUSION IMAGING STUDY (LEXISCAN) performed on 06/03/2023 Normal left ventricular systolic function with a normal LVEF of 60 to % Normal  myocardial thickening and wall motion Left ventricular cavity size normal SPECT images demonstrate homogenous tracer distribution throughout the myocardium TID ratio = 0.95 No evidence of stress-induced myocardial ischemia or arrhythmia Normal low risk study nuclear  IMPRESSION AND PLAN: NICKALOUS STINGLEY has been referred for pre-anesthesia review and clearance prior to him undergoing the planned anesthetic and procedural courses. Available labs, pertinent testing, and imaging results were personally reviewed by me in preparation for upcoming operative/procedural course. [facility] medical record has been updated following extensive record review and patient interview with PAT staff.   This patient has been appropriately cleared by cardiology with an overall LOW risk of patient experiencing significant perioperative cardiovascular complications. here at [facility]. Based on clinical review performed today (04/12/24), barring any significant acute changes in the patient's overall condition, it is anticipated that he will be able to proceed with the planned surgical intervention. Any acute  changes in clinical condition may necessitate his procedure being postponed and/or cancelled. Patient will meet with anesthesia team (MD and/or CRNA) on the day of his procedure for preoperative evaluation/assessment. Questions regarding anesthetic course will be fielded at that time.   Pre-surgical instructions were reviewed with the patient during his PAT appointment, and questions were fielded to satisfaction by PAT clinical staff. He has been instructed on which medications that he will need to hold prior to surgery, as well as the ones that have been deemed safe/appropriate to take on the day of his procedure. As part of the general education provided by PAT, patient made aware both verbally and in writing, that he would need to abstain from the use of any illegal substances during  his perioperative course. He was advised that failure to follow the provided instructions could necessitate case cancellation or result in serious perioperative complications up to and including death. Patient encouraged to contact PAT and/or his surgeon's office to discuss any questions or concerns that may arise prior to surgery; verbalized understanding.   Dorise Pereyra, MSN, APRN, FNP-C, CEN [facility]  Perioperative Services Nurse Practitioner Phone: [phone] Fax: [phone] 04/12/24 1:08 PM  NOTE: This note has been prepared using Dragon dictation software. Despite my best ability to proofread, there is always the potential that unintentional transcriptional errors may still occur from this process.

## 2024-04-20 MED ORDER — CEFAZOLIN SODIUM-DEXTROSE 2-4 GM/100ML-% IV SOLN
2.0000 g | INTRAVENOUS | Status: AC
  Administered 2024-04-21: 2 g via INTRAVENOUS

## 2024-04-20 MED ORDER — CHLORHEXIDINE GLUCONATE 0.12 % MT SOLN
15.0000 mL | Freq: Once | OROMUCOSAL | Status: AC
  Administered 2024-04-21: 15 mL via OROMUCOSAL

## 2024-04-20 MED ORDER — LACTATED RINGERS IV SOLN: INTRAVENOUS | Status: DC

## 2024-04-20 MED ORDER — ORAL CARE MOUTH RINSE: 15.0000 mL | Freq: Once | OROMUCOSAL | Status: AC

## 2024-04-21 ENCOUNTER — Other Ambulatory Visit: Payer: Self-pay

## 2024-04-21 ENCOUNTER — Ambulatory Visit: Payer: Self-pay | Admitting: Urgent Care

## 2024-04-21 ENCOUNTER — Encounter: Admission: RE | Disposition: A | Payer: Self-pay | Source: Home / Self Care | Attending: Urology

## 2024-04-21 ENCOUNTER — Encounter: Payer: Self-pay | Admitting: Urology

## 2024-04-21 ENCOUNTER — Ambulatory Visit
Admission: RE | Admit: 2024-04-21 | Discharge: 2024-04-21 | Disposition: A | Discharge status: Home / Self Care | Attending: Urology | Admitting: Urology

## 2024-04-21 DIAGNOSIS — N433 Hydrocele, unspecified: Secondary | ICD-10-CM

## 2024-04-21 HISTORY — PX: HYDROCELE EXCISION: SHX482

## 2024-04-21 SURGERY — HYDROCELECTOMY
Anesthesia: General | Site: Scrotum | Laterality: Left

## 2024-04-21 MED ORDER — DEXAMETHASONE SOD PHOSPHATE PF 10 MG/ML IJ SOLN
INTRAMUSCULAR | Status: DC | PRN
  Administered 2024-04-21: 10 mg via INTRAVENOUS

## 2024-04-21 MED ORDER — FENTANYL CITRATE (PF) 100 MCG/2ML IJ SOLN
INTRAMUSCULAR | Status: AC
  Filled 2024-04-21: qty 2

## 2024-04-21 MED ORDER — EPHEDRINE SULFATE (PRESSORS) 25 MG/5ML IV SOSY
PREFILLED_SYRINGE | INTRAVENOUS | Status: DC | PRN
  Administered 2024-04-21 (×2): 10 mg via INTRAVENOUS

## 2024-04-21 MED ORDER — BUPIVACAINE HCL 0.5 % IJ SOLN
INTRAMUSCULAR | Status: DC | PRN
  Administered 2024-04-21: 8 mL

## 2024-04-21 MED ORDER — STERILE WATER FOR IRRIGATION IR SOLN
Status: DC | PRN
  Administered 2024-04-21: 500 mL

## 2024-04-21 MED ORDER — FENTANYL CITRATE (PF) 100 MCG/2ML IJ SOLN
INTRAMUSCULAR | Status: DC | PRN
  Administered 2024-04-21: 50 ug via INTRAVENOUS
  Administered 2024-04-21 (×2): 25 ug via INTRAVENOUS

## 2024-04-21 MED ORDER — BACITRACIN ZINC 500 UNIT/GM EX OINT
TOPICAL_OINTMENT | CUTANEOUS | Status: AC
  Filled 2024-04-21: qty 28.35

## 2024-04-21 MED ORDER — ONDANSETRON HCL 4 MG/2ML IJ SOLN
INTRAMUSCULAR | Status: DC | PRN
  Administered 2024-04-21: 4 mg via INTRAVENOUS

## 2024-04-21 MED ORDER — BUPIVACAINE HCL (PF) 0.5 % IJ SOLN
INTRAMUSCULAR | Status: AC
  Filled 2024-04-21: qty 30

## 2024-04-21 MED ORDER — PROPOFOL 10 MG/ML IV BOLUS
INTRAVENOUS | Status: AC
  Filled 2024-04-21: qty 20

## 2024-04-21 MED ORDER — OXYCODONE HCL 5 MG PO TABS
ORAL_TABLET | ORAL | Status: AC
  Filled 2024-04-21: qty 1

## 2024-04-21 MED ORDER — HYDROCODONE-ACETAMINOPHEN 5-325 MG PO TABS
1.0000 | ORAL_TABLET | Freq: Four times a day (QID) | ORAL | 0 refills | Status: DC | PRN
  Filled 2024-04-21: qty 15, 4d supply, fill #0

## 2024-04-21 MED ORDER — FENTANYL CITRATE (PF) 100 MCG/2ML IJ SOLN
25.0000 ug | INTRAMUSCULAR | Status: DC | PRN
  Administered 2024-04-21 (×3): 50 ug via INTRAVENOUS

## 2024-04-21 MED ORDER — GLYCOPYRROLATE 0.2 MG/ML IJ SOLN
INTRAMUSCULAR | Status: AC
  Filled 2024-04-21: qty 1

## 2024-04-21 MED ORDER — BACITRACIN 500 UNIT/GM EX OINT
TOPICAL_OINTMENT | CUTANEOUS | Status: DC | PRN
  Administered 2024-04-21: 1 via TOPICAL

## 2024-04-21 MED ORDER — LIDOCAINE HCL (PF) 2 % IJ SOLN
INTRAMUSCULAR | Status: DC | PRN
  Administered 2024-04-21: 20 mg

## 2024-04-21 MED ORDER — SULFAMETHOXAZOLE-TRIMETHOPRIM 800-160 MG PO TABS
1.0000 | ORAL_TABLET | Freq: Two times a day (BID) | ORAL | 0 refills | Status: AC
  Filled 2024-04-21: qty 8, 4d supply, fill #0

## 2024-04-21 MED ORDER — CHLORHEXIDINE GLUCONATE 0.12 % MT SOLN
OROMUCOSAL | Status: AC
  Filled 2024-04-21: qty 15

## 2024-04-21 MED ORDER — OXYCODONE HCL 5 MG/5ML PO SOLN: 5.0000 mg | Freq: Once | ORAL | Status: AC | PRN

## 2024-04-21 MED ORDER — ONDANSETRON HCL 4 MG/2ML IJ SOLN
INTRAMUSCULAR | Status: AC
  Filled 2024-04-21: qty 2

## 2024-04-21 MED ORDER — EPHEDRINE 5 MG/ML INJ
INTRAVENOUS | Status: AC
  Filled 2024-04-21: qty 5

## 2024-04-21 MED ORDER — GLYCOPYRROLATE 0.2 MG/ML IJ SOLN
INTRAMUSCULAR | Status: DC | PRN
  Administered 2024-04-21: .2 mg via INTRAVENOUS

## 2024-04-21 MED ORDER — PROPOFOL 10 MG/ML IV BOLUS
INTRAVENOUS | Status: DC | PRN
  Administered 2024-04-21: 150 mg via INTRAVENOUS

## 2024-04-21 MED ORDER — OXYCODONE HCL 5 MG PO TABS
5.0000 mg | ORAL_TABLET | Freq: Once | ORAL | Status: AC | PRN
  Administered 2024-04-21: 5 mg via ORAL

## 2024-04-21 MED ORDER — CEFAZOLIN SODIUM-DEXTROSE 2-4 GM/100ML-% IV SOLN
INTRAVENOUS | Status: AC
  Filled 2024-04-21: qty 100

## 2024-04-21 SURGICAL SUPPLY — 30 items
BLADE CLIPPER SURG (BLADE) ×1 IMPLANT
BLADE SURG 15 STRL LF DISP TIS (BLADE) ×1 IMPLANT
CHLORAPREP W/TINT 26 (MISCELLANEOUS) ×1 IMPLANT
DRAIN PENROSE 12X.25 LTX STRL (MISCELLANEOUS) IMPLANT
DRAPE LAPAROTOMY 77X122 PED (DRAPES) ×1 IMPLANT
DRSG GAUZE FLUFF 36X18 (GAUZE/BANDAGES/DRESSINGS) ×1 IMPLANT
DRSG TELFA 3X8 NADH STRL (GAUZE/BANDAGES/DRESSINGS) ×1 IMPLANT
ELECT CAUTERY BLADE 6.4 (BLADE) IMPLANT
ELECTRODE REM PT RTRN 9FT ADLT (ELECTROSURGICAL) ×1 IMPLANT
GAUZE 4X4 16PLY ~~LOC~~+RFID DBL (SPONGE) ×1 IMPLANT
GAUZE SPONGE 4X4 12PLY STRL (GAUZE/BANDAGES/DRESSINGS) IMPLANT
GLOVE BIOGEL PI IND STRL 7.5 (GLOVE) ×1 IMPLANT
GOWN STRL REUS W/ TWL LRG LVL3 (GOWN DISPOSABLE) ×1 IMPLANT
GOWN STRL REUS W/TWL XL LVL4 (GOWN DISPOSABLE) ×1 IMPLANT
KIT TURNOVER KIT A (KITS) ×1 IMPLANT
LABEL OR SOLS (LABEL) ×1 IMPLANT
MANIFOLD NEPTUNE II (INSTRUMENTS) ×1 IMPLANT
NDL HYPO 25X1 1.5 SAFETY (NEEDLE) ×1 IMPLANT
NS IRRIG 500ML POUR BTL (IV SOLUTION) ×1 IMPLANT
PACK BASIN MINOR ARMC (MISCELLANEOUS) ×1 IMPLANT
SOLN STERILE WATER 500 ML (IV SOLUTION) ×1 IMPLANT
SUPPORETR ATHLETIC LG (MISCELLANEOUS) ×1 IMPLANT
SUT CHROMIC 3 0 SH 27 (SUTURE) IMPLANT
SUT ETHILON NAB PS2 4-0 18IN (SUTURE) IMPLANT
SUT MON AB 3-0 SH27 (SUTURE) ×1 IMPLANT
SUT VIC AB 3-0 SH 27X BRD (SUTURE) ×1 IMPLANT
SUTURE EHLN 3-0 FS-10 30 BLK (SUTURE) IMPLANT
SYR 10ML LL (SYRINGE) ×1 IMPLANT
SYR BULB IRRIG 60ML STRL (SYRINGE) ×1 IMPLANT
TRAP FLUID SMOKE EVACUATOR (MISCELLANEOUS) ×1 IMPLANT

## 2024-04-21 NOTE — Anesthesia Postprocedure Evaluation (Signed)
 Anesthesia Post Note  Patient: Vincent Patton  Procedure(s) Performed: HYDROCELECTOMY (Left: Scrotum)  Patient location during evaluation: PACU Anesthesia Type: General Level of consciousness: awake and alert Pain management: pain level controlled Vital Signs Assessment: post-procedure vital signs reviewed and stable Respiratory status: spontaneous breathing, nonlabored ventilation, respiratory function stable and patient connected to nasal cannula oxygen Cardiovascular status: blood pressure returned to baseline and stable Postop Assessment: no apparent nausea or vomiting Anesthetic complications: no   No notable events documented.   Last Vitals:  Vitals:   04/21/24 1330 04/21/24 1415  BP: (!) 163/90 (!) 151/91  Pulse: 66 66  Resp: 14 15  Temp:  36.4 C  SpO2: 98% 94%    Last Pain:  Vitals:   04/21/24 1415  TempSrc:   PainSc: 5                  Debby Mines

## 2024-04-21 NOTE — Anesthesia Preprocedure Evaluation (Signed)
 Anesthesia Evaluation  Patient identified by MRN, date of birth, ID band Patient awake    Reviewed: Allergy & Precautions, NPO status , Patient's Chart, lab work & pertinent test results  History of Anesthesia Complications Negative for: history of anesthetic complications  Airway Mallampati: III  TM Distance: <3 FB Neck ROM: full    Dental  (+) Chipped   Pulmonary neg shortness of breath, sleep apnea and Continuous Positive Airway Pressure Ventilation , former smoker   Pulmonary exam normal        Cardiovascular Exercise Tolerance: Good hypertension, + CAD  Normal cardiovascular exam     Neuro/Psych       Dementia  Neuromuscular disease    GI/Hepatic Neg liver ROS, hiatal hernia, PUD,GERD  Controlled,,  Endo/Other  negative endocrine ROS    Renal/GU      Musculoskeletal   Abdominal   Peds  Hematology negative hematology ROS (+)   Anesthesia Other Findings Past Medical History: No date: Actinic keratosis No date: Aortic atherosclerosis (HCC) No date: Barrett's esophagus without dysplasia No date: Bilateral hydrocele No date: Bilateral leg numbness No date: Bradycardia No date: CAD (coronary artery disease) No date: Cerebral microvascular disease No date: Cognitive impairment     Comment:  a.) on NMDA receptor antagonist (memantine ) No date: Diastolic dysfunction No date: Dizziness No date: DOE (dyspnea on exertion) No date: Erectile dysfunction     Comment:  a.) on PDE5i (sildenafil) No date: Erosive esophagitis No date: Genital herpes No date: GERD (gastroesophageal reflux disease) No date: Glaucoma No date: H/O bilateral inguinal hernia repair No date: Hepatic cyst No date: Hiatal hernia 2010: History of prostate cancer s/p prostatectomy No date: Hx of adenomatous colonic polyps No date: Hyperlipidemia, mixed No date: Hypertension, essential No date: Long-term use of aspirin  therapy No date:  Lumbar facet arthropathy No date: Lumbar spondylosis     Comment:  a.) s/p RIGHT far lateral metrx hemilaminectomy and               discectomy (L4-L5) 11/24/2018 No date: Meningioma (HCC) No date: Multiple pulmonary nodules determined by computed tomography  of lung No date: Nephrolithiasis No date: OSA on CPAP No date: Pedal edema No date: Primary osteoarthritis of left knee No date: Pulmonary hypertension (HCC) No date: Raynaud phenomenon No date: Renal cyst, left No date: Squamous cell skin cancer  Past Surgical History: No date: CARPAL TUNNEL RELEASE; Right No date: ESOPHAGOGASTRODUODENOSCOPY No date: INGUINAL HERNIA REPAIR; Bilateral 1983: knee arthoscopy ; Right     Comment:  torn cartlage 1979: KNEE ARTHROSCOPY; Right     Comment:  torn cartlage 10/2004: leg skin lesion biopsy / lesion     Comment:  squamous cell carcinoma 06/2008: PROSTATECTOMY 11/24/2018: RIGHT FAR LATERAL METRX HEMILAMINECTOMY AND DISCECTOMY;  Right     Comment:  Procedure: RIGHT FAR LATERAL METRX HEMILAMINECTOMY AND               DISCECTOMY; Location: [facility] No date: tear ducts surgery      Comment:  as a child 1960: TONSILLECTOMY     Reproductive/Obstetrics negative OB ROS                              Anesthesia Physical Anesthesia Plan  ASA: 3  Anesthesia Plan: General   Post-op Pain Management:    Induction: Intravenous  PONV Risk Score and Plan: 2 and Ondansetron  and Dexamethasone   Airway Management Planned: LMA  Additional  Equipment:   Intra-op Plan:   Post-operative Plan: Extubation in OR  Informed Consent: I have reviewed the patients History and Physical, chart, labs and discussed the procedure including the risks, benefits and alternatives for the proposed anesthesia with the patient or authorized representative who has indicated his/her understanding and acceptance.     Dental Advisory Given  Plan Discussed with:  Anesthesiologist, CRNA and Surgeon  Anesthesia Plan Comments: (Patient consented for risks of anesthesia including but not limited to:  - adverse reactions to medications - damage to eyes, teeth, lips or other oral mucosa - nerve damage due to positioning  - sore throat or hoarseness - Damage to heart, brain, nerves, lungs, other parts of body or loss of life  Patient voiced understanding and assent.)        Anesthesia Quick Evaluation

## 2024-04-21 NOTE — Transfer of Care (Signed)
 Immediate Anesthesia Transfer of Care Note  Patient: Vincent Patton  Procedure(s) Performed: HYDROCELECTOMY (Left: Scrotum)  Patient Location: PACU  Anesthesia Type:General  Level of Consciousness: awake, alert , and oriented  Airway & Oxygen Therapy: Patient Spontanous Breathing and Patient connected to face mask oxygen  Post-op Assessment: Report given to RN and Post -op Vital signs reviewed and stable  Post vital signs: Reviewed  Last Vitals:  Vitals Value Taken Time  BP 150/84 04/21/24 13:10  Temp    Pulse 70 04/21/24 13:11  Resp 11 04/21/24 13:11  SpO2 100 % 04/21/24 13:11  Vitals shown include unfiled device data.  Last Pain:  Vitals:   04/21/24 1001  TempSrc: Temporal  PainSc: 0-No pain         Complications: No notable events documented.

## 2024-04-21 NOTE — Anesthesia Procedure Notes (Signed)
 Procedure Name: LMA Insertion Date/Time: 04/21/2024 12:11 PM  Performed by: Leontine Katz, CRNAPre-anesthesia Checklist: Patient identified, Patient being monitored, Timeout performed, Emergency Drugs available and Suction available Patient Re-evaluated:Patient Re-evaluated prior to induction Oxygen Delivery Method: Circle system utilized Preoxygenation: Pre-oxygenation with 100% oxygen Induction Type: IV induction Ventilation: Mask ventilation without difficulty LMA: LMA inserted LMA Size: 5.0 Tube type: Oral Number of attempts: 1 Placement Confirmation: positive ETCO2 and breath sounds checked- equal and bilateral Tube secured with: Tape Dental Injury: Teeth and Oropharynx as per pre-operative assessment

## 2024-04-21 NOTE — Interval H&P Note (Signed)
 History and Physical Interval Note:  04/21/2024 11:39 AM  Vincent Patton  has presented today for surgery, with the diagnosis of Left Hydrocele.  The various methods of treatment have been discussed with the patient and family. After consideration of risks, benefits and other options for treatment, the patient has consented to  Procedure(s): HYDROCELECTOMY (Left) as a surgical intervention.  The patient's history has been reviewed, patient examined, no change in status, stable for surgery.  I have reviewed the patient's chart and labs.  Questions were answered to the patient's satisfaction.    I discussed the procedure in detail including potential risks of bleeding/hematoma, infection/abscess.  The recommendation of a postop scrotal drain was also discussed.    CV: RRR Lungs: Clear   Comfort Vincent Patton

## 2024-04-21 NOTE — Op Note (Signed)
   Preoperative diagnosis:  Left hydrocele  Postoperative diagnosis:  Left hydrocele  Procedure: Left hydrocelectomy  Surgeon: Glendia JAYSON Barba, MD  Anesthesia: General  Complications: None  Intraoperative findings: Moderate right hydrocele.  Testis normal in appearance.  130 cc of straw-colored fluid obtained  EBL: Minimal  Specimens: None  Indication: Vincent Patton is a 73 y.o. patient with a left hydrocele who has requested surgical management.  After reviewing the management options for treatment, he elected to proceed with the above surgical procedure(s). We have discussed the potential benefits and risks of the procedure, side effects of the proposed treatment, the likelihood of the patient achieving the goals of the procedure, and any potential problems that might occur during the procedure or recuperation. Informed consent has been obtained.  Description of procedure:  The patient was taken to the operating room and general anesthesia was induced.  The patient was placed in the supine position, prepped and draped in the usual sterile fashion, and preoperative antibiotics were administered. A preoperative time-out was performed.   A transverse incision was made in the midportion of the left hemiscrotum.  Dartos fascia was incised with cautery to expose the hydrocele sac.  The hydrocele sac was then separated from scrotal walls with a combination of blunt dissection and cautery and delivered into the operative field.  The sac was then incised anteriorly and 130 mL of straw-colored fluid was suctioned.    The hydrocele sac was then opened anteriorly with cautery.  The testis and cord structures were normal in appearance.  There was no evidence of a communicating hydrocele.  The hydrocele sac was excised with cautery with a rim that was everted and the edges sutured with 3-0 Vicryl suture.  A 1/4 inch Penrose drain was placed through the dependent portion of the left hemiscrotum  via a separate stab incision and secured with 3-0 nylon.  The testis was delivered back into the scrotum in its anatomic position.  The operative site was copiously gated with saline.  The incision was anesthetized with 0.5% plain Sensorcaine .  The dartos was closed with a running suture of 3-0 Vicryl and the skin was closed with a running horizontal mattress suture of 3-0 chromic.  A dressing of fluffs and scrotal support was applied.  The patient was transported to the PACU in stable condition after anesthetic reversal.   Glendia JAYSON Barba, M.D.

## 2024-04-21 NOTE — Discharge Instructions (Addendum)
 Discharge instructions following scrotal surgery  Call your doctor for: Fever is greater than 100.5 Severe nausea or vomiting Increasing pain not controlled by pain medication Increasing redness or drainage from incisions  The number for questions or concerns is [phone] [facility] [place])  Activity level: No lifting greater than 10 pounds (about equal to gallon of milk) for the next 2 weeks or until cleared to do so at follow-up appointment.  Otherwise activity as tolerated by comfort level.  Medications: You may resume your regular medications A prescription for hydrocodone was sent to your pharmacy to take as needed for pain.  You may also take ibuprofen or acetaminophen  if hydrocodone not needed or desired  Diet: May resume your regular diet as tolerated  Driving: No driving while still taking opiate pain medications (weight at least 6-8 hours after last dose).  No driving if you still sore from surgery as it may limit your ability to react quickly if necessary.   Shower/bath: Sponge bath only until scrotal drain has been removed.  May shower after drain removal.  No tub bath, hot tub or swimming for 7 days.  Wound care: You may cover wounds with sterile gauze as needed to prevent incisions rubbing on clothing.  Wear supportive underwear (boxer briefs, compression shorts, jockey shorts, athletic supporter) for at least 2 weeks.  You should apply cold compresses (ice pack of frozen peas/corn) to your scrotum (not directly on the scrotal skin) intermittently for at least 48 hours to reduce the swelling.  You should expect that his scrotum will swell up initially and then get smaller over the next 2-4 weeks.  Bruising is also normal.  Sutures will dissolve and do not need to be removed.  Follow-up appointments: Office will contact you for an appointment for drain removal on Monday, 04/25/2024 1 month postop follow-up has been scheduled 05/31/2024

## 2024-04-22 ENCOUNTER — Encounter: Payer: Self-pay | Admitting: Urology

## 2024-04-25 ENCOUNTER — Ambulatory Visit: Admitting: Physician Assistant

## 2024-04-25 VITALS — BP 142/85 | HR 69

## 2024-04-25 DIAGNOSIS — N433 Hydrocele, unspecified: Secondary | ICD-10-CM

## 2024-04-25 NOTE — Patient Instructions (Signed)
Swelling in the scrotum may take some time to resolve, as the scrotum is located below your torso and fluid has to work against gravity to leave this tissue. To promote resolution of swelling and decrease discomfort, please follow the scrotal support instructions below.  Scrotal support instructions:  Wear compressive underwear, e.g. jockstrap or snug boxer briefs, to keep the scrotum supported and promote drainage of swelling. Elevate the scrotum when at rest. Tuck a rolled washcloth or towel underneath the scrotum to lift it up and promote drainage back into your abdomen. Use ice as needed for pain relief. Never apply ice to the scrotum for longer than 20 minutes at a time and always keep a layer of fabric between the ice and the skin.  

## 2024-04-25 NOTE — Progress Notes (Signed)
 Drain Removal  Patient is present today for drain removal. He is POD4 from left hydrocelectomy with Dr. Twylla.  Using sterile scissors and forceps, the securing stitch was clipped. The Penrose drain and stitch were removed in their entirety; patient tolerated well. We discussed scrotal support instructions.  Performed by: Dollie Mayse, PA-C   Follow up: Return for Keep follow-up as scheduled.

## 2024-05-03 ENCOUNTER — Encounter: Payer: Self-pay | Admitting: Neurology

## 2024-05-03 ENCOUNTER — Other Ambulatory Visit: Payer: Self-pay | Admitting: Neurology

## 2024-05-03 DIAGNOSIS — D329 Benign neoplasm of meninges, unspecified: Secondary | ICD-10-CM

## 2024-05-09 ENCOUNTER — Other Ambulatory Visit: Payer: Self-pay | Admitting: Orthopedic Surgery

## 2024-05-10 ENCOUNTER — Ambulatory Visit
Admission: RE | Admit: 2024-05-10 | Discharge: 2024-05-10 | Disposition: A | Discharge status: Home / Self Care | Source: Ambulatory Visit | Attending: Neurology | Admitting: Neurology

## 2024-05-10 DIAGNOSIS — D329 Benign neoplasm of meninges, unspecified: Secondary | ICD-10-CM

## 2024-05-23 ENCOUNTER — Encounter
Admission: RE | Admit: 2024-05-23 | Discharge: 2024-05-23 | Disposition: A | Discharge status: Home / Self Care | Source: Ambulatory Visit | Attending: Orthopedic Surgery | Admitting: Orthopedic Surgery

## 2024-05-23 ENCOUNTER — Other Ambulatory Visit: Payer: Self-pay

## 2024-05-23 VITALS — BP 145/82 | HR 75 | Resp 16 | Ht 72.0 in | Wt 177.9 lb

## 2024-05-23 DIAGNOSIS — I4510 Unspecified right bundle-branch block: Secondary | ICD-10-CM | POA: Insufficient documentation

## 2024-05-23 DIAGNOSIS — Z01818 Encounter for other preprocedural examination: Secondary | ICD-10-CM | POA: Diagnosis present

## 2024-05-23 DIAGNOSIS — Z01810 Encounter for preprocedural cardiovascular examination: Secondary | ICD-10-CM | POA: Diagnosis not present

## 2024-05-23 DIAGNOSIS — Z96652 Presence of left artificial knee joint: Secondary | ICD-10-CM

## 2024-05-23 DIAGNOSIS — Z01812 Encounter for preprocedural laboratory examination: Secondary | ICD-10-CM

## 2024-05-23 HISTORY — DX: Personal history of urinary calculi: Z87.442

## 2024-05-23 LAB — URINALYSIS, ROUTINE W REFLEX MICROSCOPIC
Bilirubin Urine: NEGATIVE
Glucose, UA: NEGATIVE mg/dL
Ketones, ur: NEGATIVE mg/dL
Leukocytes,Ua: NEGATIVE
Nitrite: NEGATIVE
Protein, ur: NEGATIVE mg/dL
Specific Gravity, Urine: 1.014 (ref 1.005–1.030)
Squamous Epithelial / HPF: 0 /HPF (ref 0–5)
pH: 6 (ref 5.0–8.0)

## 2024-05-23 LAB — SURGICAL PCR SCREEN
MRSA, PCR: NEGATIVE
Staphylococcus aureus: NEGATIVE

## 2024-05-23 LAB — CBC WITH DIFFERENTIAL/PLATELET
Abs Immature Granulocytes: 0.01 K/uL (ref 0.00–0.07)
Basophils Absolute: 0 K/uL (ref 0.0–0.1)
Basophils Relative: 0 %
Eosinophils Absolute: 0.1 K/uL (ref 0.0–0.5)
Eosinophils Relative: 1 %
HCT: 46.8 % (ref 39.0–52.0)
Hemoglobin: 15.5 g/dL (ref 13.0–17.0)
Immature Granulocytes: 0 %
Lymphocytes Relative: 19 %
Lymphs Abs: 0.9 K/uL (ref 0.7–4.0)
MCH: 30 pg (ref 26.0–34.0)
MCHC: 33.1 g/dL (ref 30.0–36.0)
MCV: 90.5 fL (ref 80.0–100.0)
Monocytes Absolute: 0.5 K/uL (ref 0.1–1.0)
Monocytes Relative: 10 %
Neutro Abs: 3.4 K/uL (ref 1.7–7.7)
Neutrophils Relative %: 70 %
Platelets: 156 K/uL (ref 150–400)
RBC: 5.17 MIL/uL (ref 4.22–5.81)
RDW: 12.7 % (ref 11.5–15.5)
WBC: 4.9 K/uL (ref 4.0–10.5)
nRBC: 0 % (ref 0.0–0.2)

## 2024-05-23 LAB — COMPREHENSIVE METABOLIC PANEL WITH GFR
ALT: 11 U/L (ref 0–44)
AST: 20 U/L (ref 15–41)
Albumin: 4.3 g/dL (ref 3.5–5.0)
Alkaline Phosphatase: 94 U/L (ref 38–126)
Anion gap: 8 (ref 5–15)
BUN: 13 mg/dL (ref 8–23)
CO2: 30 mmol/L (ref 22–32)
Calcium: 9.7 mg/dL (ref 8.9–10.3)
Chloride: 103 mmol/L (ref 98–111)
Creatinine, Ser: 0.89 mg/dL (ref 0.61–1.24)
GFR, Estimated: >60 mL/min
Glucose, Bld: 107 mg/dL — ABNORMAL HIGH (ref 70–99)
Potassium: 4.5 mmol/L (ref 3.5–5.1)
Sodium: 141 mmol/L (ref 135–145)
Total Bilirubin: 0.5 mg/dL (ref 0.0–1.2)
Total Protein: 6.7 g/dL (ref 6.5–8.1)

## 2024-05-23 NOTE — Progress Notes (Signed)
 Clearance Note from last 04-21-24 surgery   Elnor Dorise BRAVO, NP   Nurse Practitioner Anesthesiology   Progress Notes    Signed   Date of Service: 04/12/2024  1:00 PM   Signed     Expand All Collapse All   Perioperative / Anesthesia Services   Pre-Admission Testing Clinical Review / Pre-Operative Anesthesia Consult   Date: 04/12/24   PATIENT DEMOGRAPHICS: Name: Vincent Patton DOB: 15-Mar-1951 MRN:   [id number]   Note: Available PAT nursing documentation and vital signs have been reviewed. Clinical nursing staff has updated patient's PMH/PSHx, current medication list, and drug allergies/intolerances to ensure complete and comprehensive history available to assist care teams in MDM as it pertains to the aforementioned surgical procedure and anticipated anesthetic course. Extensive review of available clinical information personally performed. Nursing documentation reviewed. [place] [facility] and PSHx updated with any diagnoses and/or procedures that I have knowledge of that may have been inadvertently omitted during his intake with the pre-admission testing department's nursing staff.   PLANNED SURGICAL PROCEDURE(S):     Case: [id number] Date/Time: 04/21/24 0950    Procedure: HYDROCELECTOMY (Left)    Anesthesia type: General    Diagnosis: Left hydrocele [N43.3]    Pre-op diagnosis: Left Hydrocele    Location: [facility] OR ROOM 10 / [facility]S FOR [facility]    Surgeons: Twylla Glendia BROCKS, MD           CLINICAL DISCUSSION: Vincent Patton is a 74 y.o. male who is submitted for pre-surgical anesthesia review and clearance prior to him undergoing the above procedure.  Patient is a Former Games Developer. Pertinent PMH includes: CAD, diastolic dysfunction, bradycardia, chronic cerebral microvascular disease, aortic atherosclerosis, HTN, HLD, pulmonary hypertension, DOE, COPD, OSAH (requires nocturnal PAP therapy), GERD (on daily PPI), erosive esophagitis, hiatal hernia, Barrett's esophagus, meningioma,  glaucoma, nephrolithiasis,  LEFT hydrocele, peripheral edema, OA, lumbar spondylosis (s/p L4-L5 RIGHT hemilaminectomy), cognitive impairment.   Patient is followed by cardiology Andrea, MD). He was last seen in the cardiology clinic on 12/16/2023; notes reviewed. At the time of his clinic visit, planes of a 46-month history of intermittent exertional chest tightness that was brief in nature and that resolved with rest.  Patient denied any associated shortness of breath, PND, orthopnea, palpitations, significant peripheral edema, weakness, fatigue, vertiginous symptoms, or presyncope/syncope. Patient with a past medical history significant for cardiovascular diagnoses. Documented physical exam was grossly benign, providing no evidence of acute exacerbation and/or decompensation of the patient's known cardiovascular conditions.   Coronary CTA was performed on 08/01/2021 that demonstrated an Agatston coronary artery calcium score of 352. This placed patient in the 66th percentile for age, sex, and race matched controls. Calcium depositions noted to be isolated mainly in the proximal-mid LAD (50%) and proximal RCA/LCx (25%) distributions.  Study demonstrated normal coronary origin with RIGHT dominance.  Study was sent for FFR CT analysis (ranges: < 0.75 high likelihood of hemodynamically significant stenosis, 0.76-0.80 borderline, > 0.80 normal):   Left Main:  No significant stenosis. LAD: No significant stenosis.  FFRct 0.87 LCX: No significant stenosis.  FFRct 0.90 RCA: No significant stenosis.  FFRct 0.86   Myocardial perfusion imaging study performed on 06/03/2023 revealed a normal left ventricular systolic function with an EF of 62%.  There was no evidence of stress-induced myocardial ischemia or arrhythmia; no scintigraphic evidence of scar. TID ratio = 0.95 (normal range </= 1.2).  Study was determined to be normal and low risk.   Most recent TTE performed on 12/23/2023 revealed  a normal left  ventricular systolic function with an EF of >55 %. There was mild LVH.  There were no regional wall motion abnormalities. Left ventricular diastolic Doppler parameters consistent with pseudonormalization (G2DD). Right ventricular size and function normal with a TAPSE measuring 2.5 cm  (normal range >/= 1.6 cm).  RVSP = 35.0 mmHg consistent with mild pulmonary hypertension.  There was mild pan valvular regurgitation. All transvalvular gradients were noted to be normal providing no evidence of hemodynamically significant valvular stenosis. Aorta normal in size with no evidence of ectasia or aneurysmal dilatation.   Blood pressure well controlled at 126/80 mmHg on currently prescribed CCB (amlodipine ) monotherapy.  He is not taking any type of lipid-lowering therapies for his HLD diagnosis and ASCVD prevention. In the setting of known cardiovascular diagnoses, it is important note that patient is on a PDE5i medication (sildenafil) for an erectile dysfunction diagnosis.  Patient is not diabetic.  He does have an OSAH diagnosis and is reported to be compliant with prescribed nocturnal PAP therapy. Patient is able to complete all of his  ADL/IADLs without cardiovascular limitation.  Per the DASI, patient is able to achieve at least 4 METS of physical activity without experiencing any significant degree of angina/anginal equivalent symptoms. No changes were made to his medication regimen during his visit with cardiology.  Patient scheduled to follow-up with outpatient cardiology in 6 months or sooner if needed.    Vincent Patton is scheduled for an elective HYDROCELECTOMY (Left) on 04/22/2023 with Dr. Glendia JAYSON Barba, MD. Given patient's past medical history significant for cardiovascular diagnoses, presurgical cardiac clearance was sought by the PAT team. Per cardiology, this patient is optimized for surgery and may proceed with the planned procedural course with a LOW risk of significant perioperative cardiovascular  complications.   In review of the patient's medication reconciliation, it is noted that he is on daily oral antithrombotic therapy. He has been instructed on recommendations for holding his daily low-dose ASA for 5 days prior to his procedure with plans to restart as soon as postoperative bleeding risk felt to be minimized by his primary attending surgeon. The patient has been instructed that his last dose should be on 04/15/2024.   Patient denies previous perioperative complications with anesthesia in the past. In review his EMR, it is noted that patient underwent a general anesthetic course here at [facility] (ASA III) in 01/2024 without documented complications.    MOST RECENT VITAL SIGNS:     03/23/2024   10:30 AM 03/01/2024    3:25 PM 02/02/2024   11:00 AM  Vitals with BMI  Height 6' 0 6' 0    Weight 178 lbs 175 lbs 3 oz    BMI 24.14 23.76    Systolic 146 156 870  Diastolic 82 91 74  Pulse 67 75 58    PROVIDERS/SPECIALISTS: NOTE: Primary physician provider listed below. Patient may have been seen by APP or partner within same practice.    PROVIDER ROLE / SPECIALTY LAST [facility]ERLEAN Barba Glendia JAYSON, MD Urology (Surgeon) 03/23/2024  Glover Lenis, MD Primary Care Provider 11/30/2023  Ammon Blunt, MD Cardiology 12/16/2023  Dodson Nest, MD Physiatry 01/05/2024  Maree Hila, MD Neurology 04/11/2024    ALLERGIES: Allergies       Allergies  Allergen Reactions   Lodine [Etodolac] Swelling      He says tongue swelled up   Tamsulosin Other (See Comments) and Shortness Of Breath      Feels  like he will pass out while on it with SOB        CURRENT HOME MEDICATIONS: No current facility-administered medications for this encounter.     acetaminophen  (TYLENOL ) 500 MG tablet   amLODipine  (NORVASC ) 5 MG tablet   aspirin  81 MG chewable tablet   cholecalciferol (VITAMIN D3) 25 MCG (1000 UNIT) tablet   cyanocobalamin (VITAMIN B12) 1000  MCG tablet   docusate sodium  (COLACE) 100 MG capsule   magnesium gluconate (MAGONATE) 500 (27 Mg) MG TABS tablet   memantine  (NAMENDA ) 5 MG tablet   pantoprazole  (PROTONIX ) 40 MG tablet   Potassium 99 MG TABS   sildenafil (REVATIO) 20 MG tablet   timolol  (TIMOPTIC ) 0.5 % ophthalmic solution    HISTORY:     Past Medical History:  Diagnosis Date   Actinic keratosis     Aortic atherosclerosis     Barrett's esophagus without dysplasia     Bilateral hydrocele     Bilateral leg numbness     Bradycardia     CAD (coronary artery disease)     Cerebral microvascular disease     Cognitive impairment      a.) on NMDA receptor antagonist (memantine )   Diastolic dysfunction     Dizziness     DOE (dyspnea on exertion)     Erectile dysfunction      a.) on PDE5i (sildenafil)   Erosive esophagitis     Genital herpes     GERD (gastroesophageal reflux disease)     Glaucoma     H/O bilateral inguinal hernia repair     Hepatic cyst     Hiatal hernia     History of prostate cancer s/p prostatectomy 2010   Hx of adenomatous colonic polyps     Hyperlipidemia, mixed     Hypertension, essential     Left hydrocele     Long-term use of aspirin  therapy     Lumbar facet arthropathy     Lumbar spondylosis      a.) s/p RIGHT far lateral metrx hemilaminectomy and discectomy (L4-L5) 11/24/2018   Meningioma (HCC)     Multiple pulmonary nodules determined by computed tomography of lung     Nephrolithiasis     OSA on CPAP     Pedal edema     Primary osteoarthritis of left knee     Pulmonary hypertension (HCC)     Raynaud phenomenon     Renal cyst, left     Squamous cell skin cancer               Past Surgical History:  Procedure Laterality Date   CARPAL TUNNEL RELEASE Right     ESOPHAGOGASTRODUODENOSCOPY       INGUINAL HERNIA REPAIR Bilateral     knee arthoscopy  Right 1983    torn cartlage   KNEE ARTHROSCOPY Right 1979    torn cartlage   leg skin lesion biopsy / lesion   10/2004     squamous cell carcinoma   PROSTATECTOMY   06/2008   RIGHT FAR LATERAL METRX HEMILAMINECTOMY AND DISCECTOMY Right 11/24/2018    Procedure: RIGHT FAR LATERAL METRX HEMILAMINECTOMY AND DISCECTOMY; Location: [facility]   tear ducts surgery         as a child   TONSILLECTOMY   1960   TOTAL KNEE ARTHROPLASTY Left 02/01/2024    Procedure: ARTHROPLASTY, KNEE, TOTAL;  Surgeon: Lorelle Hussar, MD;  Location: [facility] ORS;  Service: Orthopedics;  Laterality: Left;  No family history on file.     Social History         Tobacco Use   Smoking status: Former      Types: Cigarettes   Smokeless tobacco: Never  Substance Use Topics   Alcohol use: Yes      Comment: beer once a month    LABS:  Recent Labs       Lab Results  Component Value Date    WBC 8.3 02/02/2024    HGB 13.4 02/02/2024    HCT 37.3 (L) 02/02/2024    MCV 91.2 02/02/2024    PLT 145 (L) 02/02/2024      Recent Labs       Lab Results  Component Value Date    NA 133 (L) 02/02/2024    CL 101 02/02/2024    K 4.1 02/02/2024    CO2 26 02/02/2024    BUN 12 02/02/2024    CREATININE 0.71 02/02/2024    GFRNONAA >60 02/02/2024    CALCIUM 8.4 (L) 02/02/2024    ALBUMIN 4.0 01/25/2024    GLUCOSE 125 (H) 02/02/2024        ECG: Date: 12/16/2023  Time ECG obtained: 1543 PM Rate: 54 bpm Rhythm: Sinus bradycardia Axis (leads I and aVF): normal Intervals: PR 200 ms. QRS 106 ms. QTc 403 ms. ST segment and T wave changes: No evidence of acute T wave abnormalities or significant ST segment elevation or depression.  Evidence of a possible, age undetermined, prior infarct:  Yes; anterior Comparison: Similar to previous tracing obtained on 08/24/2020 NOTE: Tracing obtained at [facility]; unable for review. Above based on cardiologist's interpretation.    IMAGING / PROCEDURES: US  SCROTUM performed on 03/08/2024 Moderate to large left hydrocele. 3 mm right testicular cyst. Small left epididymal cyst. Bilateral  testicular microlithiasis.   MR LUMBAR SPINE W WO CONTRAST performed on 12/14/2023 L3-4: Intermediate-sized left asymmetric disc bulge with narrowing of the left lateral recess and moderate left foraminal stenosis. Extraforaminal disc component has been resected L4-5: Unchanged intermediate-sized disc bulge with endplate swelling and severe right and mild left foraminal stenosis.   TRANSTHORACIC ECHOCARDIOGRAM performed on 12/23/2023 Normal left ventricular systolic function with an EF of >55% Mild LVH No regional wall motion abnormalities Left ventricular diastolic Doppler parameters consistent with pseudonormalization (G2DD). Normal right ventricular size and function Mild pan valvular regurgitation RVSP = 35 mmHg  Normal transvalvular gradients; no valvular stenosis   MR LUMBAR SPINE W WO CONTRAST performed on 12/14/2023 L3-4: Intermediate-sized left asymmetric disc bulge with narrowing of the left lateral recess and moderate left foraminal stenosis. Extraforaminal disc component has been resected. L4-5: Unchanged intermediate-sized disc bulge with endplate swelling and severe right and mild left foraminal stenosis.   CT RENAL STONE STUDY performed on 09/13/2023 Small hiatal hernia Subcentimeter LEFT hepatic cyst (benign) 2.3 cm simple LEFT upper pole renal cyst (benign) 2 mm distal LEFT ureteral calculus just above the UVJ with mild LEFT hydronephrosis Status post prostatectomy BILATERAL hydroceles; moderate left and small right Atherosclerotic calcifications of the abdominal aorta and branch vessels   MR BRAIN W WO CONTRAST performed on 07/07/2023 No significant change in the size of 3.7 x 2.2 cm meningioma centered along the right cavernous sinus. Mild chronic microvascular ischemic changes.   DIAGNOSTIC RADIOGRAPHS OF LEFT KNEE 4+ VIEWS performed on 06/26/2023 Complete loss of joint space in the medial compartment of the left knee with varus deformity and sclerotic changes  along the medial tibial plateau. Patellofemoral osteoarthritis with normal  tracking of the patella in the trochlear groove.   Mid PA flexion view shows no valgus or varus alignment but continued joint space narrowing with complete loss of joint space in the medial compartment.    MYOCARDIAL PERFUSION IMAGING STUDY (LEXISCAN) performed on 06/03/2023 Normal left ventricular systolic function with a normal LVEF of 60 to % Normal myocardial thickening and wall motion Left ventricular cavity size normal SPECT images demonstrate homogenous tracer distribution throughout the myocardium TID ratio = 0.95 No evidence of stress-induced myocardial ischemia or arrhythmia Normal low risk study nuclear   IMPRESSION AND PLAN: Vincent Patton has been referred for pre-anesthesia review and clearance prior to him undergoing the planned anesthetic and procedural courses. Available labs, pertinent testing, and imaging results were personally reviewed by me in preparation for upcoming operative/procedural course. [facility] medical record has been updated following extensive record review and patient interview with PAT staff.    This patient has been appropriately cleared by cardiology with an overall LOW risk of patient experiencing significant perioperative cardiovascular complications. here at [facility]. Based on clinical review performed today (04/12/24), barring any significant acute changes in the patient's overall condition, it is anticipated that he will be able to proceed with the planned surgical intervention. Any acute changes in clinical condition may necessitate his procedure being postponed and/or cancelled. Patient will meet with anesthesia team (MD and/or CRNA) on the day of his procedure for preoperative evaluation/assessment. Questions regarding anesthetic course will be fielded at that time.    Pre-surgical instructions were reviewed with the patient during his PAT  appointment, and questions were fielded to satisfaction by PAT clinical staff. He has been instructed on which medications that he will need to hold prior to surgery, as well as the ones that have been deemed safe/appropriate to take on the day of his procedure. As part of the general education provided by PAT, patient made aware both verbally and in writing, that he would need to abstain from the use of any illegal substances during his perioperative course. He was advised that failure to follow the provided instructions could necessitate case cancellation or result in serious perioperative complications up to and including death. Patient encouraged to contact PAT and/or his surgeon's office to discuss any questions or concerns that may arise prior to surgery; verbalized understanding.    Dorise Pereyra , MSN, APRN, FNP-C, CEN [facility]  Perioperative Services Nurse Practitioner Phone: [phone] Fax: [phone] 04/12/24 1:08 PM   NOTE: This note has been prepared using Dragon dictation software. Despite my best ability to proofread, there is always the potential that unintentional transcriptional errors may still occur from this process.        Electronically signed by Pereyra Dorise BRAVO, NP at 04/12/2024  1:09 PM     Admission (Discharged) on 04/21/2024       Detailed Report      Note shared with patient

## 2024-05-23 NOTE — Patient Instructions (Addendum)
 Your procedure is scheduled on: 05/30/24 - Monday Report to the Registration Desk on the 1st floor of [facility]. To find out your arrival time, please call [phone] between 1PM - 3PM on: 05/27/24 - Friday If your arrival time is 6:00 am, do not arrive before that time as the [facility] entrance doors do not open until 6:00 am.  REMEMBER: Instructions that are not followed completely may result in serious medical risk, up to and including death; or upon the discretion of your surgeon and anesthesiologist your surgery may need to be rescheduled.  Do not eat food after midnight the night before surgery.  No gum chewing or hard candies.  You may however, drink CLEAR liquids up to 2 hours before you are scheduled to arrive for your surgery. Do not drink anything within 2 hours of your scheduled arrival time.  Clear liquids include: - water   - apple juice without pulp - gatorade (not RED colors) - black coffee or tea (Do NOT add milk or creamers to the coffee or tea) Do NOT drink anything that is not on this list.  In addition, your doctor has ordered for you to drink the provided:  Ensure Pre-Surgery Clear Carbohydrate Drink  Drinking this carbohydrate drink up to two hours before surgery helps to reduce insulin resistance and improve patient outcomes. Please complete drinking 2 hours before scheduled arrival time.  One week prior to surgery: Stop Anti-inflammatories (NSAIDS) such as Advil, Aleve, Ibuprofen, Motrin, Naproxen, Naprosyn and Aspirin  based products such as Excedrin, Goody's Powder, BC Powder. You may continue to take Tylenol  if needed for pain up until the day of surgery.  Stop ANY OVER THE COUNTER supplements until after surgery - VITAMIN D3, magnesium gluconate ,Potassium   sildenafil (REVATIO) - hold 2 days prior to your surgery.  ON THE DAY OF SURGERY ONLY TAKE THESE MEDICATIONS WITH SIPS OF WATER :  amLODipine  (NORVASC )  memantine  (NAMENDA )  pantoprazole   (PROTONIX )  timolol  (TIMOPTIC )    No Alcohol for 24 hours before or after surgery.  No Smoking including e-cigarettes for 24 hours before surgery.  No chewable tobacco products for at least 6 hours before surgery.  No nicotine patches on the day of surgery.  Do not use any recreational drugs for at least a week (preferably 2 weeks) before your surgery.  Please be advised that the combination of cocaine and anesthesia may have negative outcomes, up to and including death. If you test positive for cocaine, your surgery will be cancelled.  On the morning of surgery brush your teeth with toothpaste and water , you may rinse your mouth with mouthwash if you wish. Do not swallow any toothpaste or mouthwash.  Use CHG Soap or wipes as directed on instruction sheet.  Do not wear jewelry, make-up, hairpins, clips or nail polish.  For welded (permanent) jewelry: bracelets, anklets, waist bands, etc.  Please have this removed prior to surgery.  If it is not removed, there is a chance that hospital personnel will need to cut it off on the day of surgery.  Do not wear lotions, powders, or perfumes.   Do not shave body hair from the neck down 48 hours before surgery.  Contact lenses, hearing aids and dentures may not be worn into surgery.  Do not bring valuables to the hospital. [facility] is not responsible for any missing/lost belongings or valuables.   Bring your C-PAP to the hospital in case you may have to spend the night.   Notify  your doctor if there is any change in your medical condition (cold, fever, infection).  Wear comfortable clothing (specific to your surgery type) to the hospital.  After surgery, you can help prevent lung complications by doing breathing exercises.  Take deep breaths and cough every 1-2 hours. Your doctor may order a device called an Incentive Spirometer to help you take deep breaths.                If you are being admitted to the hospital overnight, leave  your suitcase in the car. After surgery it may be brought to your room.  In case of increased patient census, it may be necessary for you, the patient, to continue your postoperative care in the Same Day Surgery department.  If you are being discharged the day of surgery, you will not be allowed to drive home. You will need a responsible individual to drive you home and stay with you for 24 hours after surgery.   If you are taking public transportation, you will need to have a responsible individual with you.  Please call the Pre-admissions Testing Dept. at [phone] if you have any questions about these instructions.  Surgery Visitation Policy:  Patients having surgery or a procedure may have two visitors.  Children under the age of 44 must have an adult with them who is not the patient.  Inpatient Visitation:    Visiting hours are 7 a.m. to 8 p.m. Up to four visitors are allowed at one time in a patient room. The visitors may rotate out with other people during the day.  One visitor age 67 or older may stay with the patient overnight and must be in the room by 8 p.m.   Merchandiser, Retail to address health-related social needs:  [email or web address]    Pre-operative 4 CHG Bath Instructions   You can play a key role in reducing the risk of infection after surgery. Your skin needs to be as free of germs as possible. You can reduce the number of germs on your skin by washing with CHG (chlorhexidine  gluconate) soap before surgery. CHG is an antiseptic soap that kills germs and continues to kill germs even after washing.   DO NOT use if you have an allergy to chlorhexidine /CHG or antibacterial soaps. If your skin becomes reddened or irritated, stop using the CHG and notify one of our RNs at [phone].   Please shower with the CHG soap starting 4 days before surgery using the following schedule: 01/08 - 01/11.    Please keep in mind the following:  DO  NOT shave, including legs and underarms, starting the day of your first shower.   You may shave your face at any point before/day of surgery.  Place clean sheets on your bed the day you start using CHG soap. Use a clean washcloth (not used since being washed) for each shower. DO NOT sleep with pets once you start using the CHG.   CHG Shower Instructions:  If you choose to wash your hair and private area, wash first with your normal shampoo/soap.  After you use shampoo/soap, rinse your hair and body thoroughly to remove shampoo/soap residue.  Turn the water  OFF and apply about 3 tablespoons (45 ml) of CHG soap to a CLEAN washcloth.  Apply CHG soap ONLY FROM YOUR NECK DOWN TO YOUR TOES (washing for 3-5 minutes)  DO NOT use CHG soap on face, private areas, open wounds, or sores.  Pay special attention to the area  where your surgery is being performed.  If you are having back surgery, having someone wash your back for you may be helpful. Wait 2 minutes after CHG soap is applied, then you may rinse off the CHG soap.  Pat dry with a clean towel  Put on clean clothes/pajamas   If you choose to wear lotion, please use ONLY the CHG-compatible lotions on the back of this paper.     Additional instructions for the day of surgery: DO NOT APPLY any lotions, deodorants, cologne, or perfumes.   Put on clean/comfortable clothes.  Brush your teeth.  Ask your nurse before applying any prescription medications to the skin.      CHG Compatible Lotions   Aveeno Moisturizing lotion  Cetaphil Moisturizing Cream  Cetaphil Moisturizing Lotion  Clairol Herbal Essence Moisturizing Lotion, Dry Skin  Clairol Herbal Essence Moisturizing Lotion, Extra Dry Skin  Clairol Herbal Essence Moisturizing Lotion, Normal Skin  Curel Age Defying Therapeutic Moisturizing Lotion with Alpha Hydroxy  Curel Extreme Care Body Lotion  Curel Soothing Hands Moisturizing Hand Lotion  Curel Therapeutic Moisturizing Cream,  Fragrance-Free  Curel Therapeutic Moisturizing Lotion, Fragrance-Free  Curel Therapeutic Moisturizing Lotion, Original Formula  Eucerin Daily Replenishing Lotion  Eucerin Dry Skin Therapy Plus Alpha Hydroxy Crme  Eucerin Dry Skin Therapy Plus Alpha Hydroxy Lotion  Eucerin Original Crme  Eucerin Original Lotion  Eucerin Plus Crme Eucerin Plus Lotion  Eucerin TriLipid Replenishing Lotion  Keri Anti-Bacterial Hand Lotion  Keri Deep Conditioning Original Lotion Dry Skin Formula Softly Scented  Keri Deep Conditioning Original Lotion, Fragrance Free Sensitive Skin Formula  Keri Lotion Fast Absorbing Fragrance Free Sensitive Skin Formula  Keri Lotion Fast Absorbing Softly Scented Dry Skin Formula  Keri Original Lotion  Keri Skin Renewal Lotion Keri Silky Smooth Lotion  Keri Silky Smooth Sensitive Skin Lotion  Nivea Body Creamy Conditioning Oil  Nivea Body Extra Enriched Lotion  Nivea Body Original Lotion  Nivea Body Sheer Moisturizing Lotion Nivea Crme  Nivea Skin Firming Lotion  NutraDerm 30 Skin Lotion  NutraDerm Skin Lotion  NutraDerm Therapeutic Skin Cream  NutraDerm Therapeutic Skin Lotion  ProShield Protective Hand Cream  Provon moisturizing lotion  How to Use an Incentive Spirometer  An incentive spirometer is a tool that measures how well you are filling your lungs with each breath. Learning to take long, deep breaths using this tool can help you keep your lungs clear and active. This may help to reverse or lessen your chance of developing breathing (pulmonary) problems, especially infection. You may be asked to use a spirometer: After a surgery. If you have a lung problem or a history of smoking. After a long period of time when you have been unable to move or be active. If the spirometer includes an indicator to show the highest number that you have reached, your health care provider or respiratory therapist will help you set a goal. Keep a log of your progress as told by  your health care provider. What are the risks? Breathing too quickly may cause dizziness or cause you to pass out. Take your time so you do not get dizzy or light-headed. If you are in pain, you may need to take pain medicine before doing incentive spirometry. It is harder to take a deep breath if you are having pain. How to use your incentive spirometer  Sit up on the edge of your bed or on a chair. Hold the incentive spirometer so that it is in an upright position. Before you use  the spirometer, breathe out normally. Place the mouthpiece in your mouth. Make sure your lips are closed tightly around it. Breathe in slowly and as deeply as you can through your mouth, causing the piston or the ball to rise toward the top of the chamber. Hold your breath for 3-5 seconds, or for as long as possible. If the spirometer includes a coach indicator, use this to guide you in breathing. Slow down your breathing if the indicator goes above the marked areas. Remove the mouthpiece from your mouth and breathe out normally. The piston or ball will return to the bottom of the chamber. Rest for a few seconds, then repeat the steps 10 or more times. Take your time and take a few normal breaths between deep breaths so that you do not get dizzy or light-headed. Do this every 1-2 hours when you are awake. If the spirometer includes a goal marker to show the highest number you have reached (best effort), use this as a goal to work toward during each repetition. After each set of 10 deep breaths, cough a few times. This will help to make sure that your lungs are clear. If you have an incision on your chest or abdomen from surgery, place a pillow or a rolled-up towel firmly against the incision when you cough. This can help to reduce pain while taking deep breaths and coughing. General tips When you are able to get out of bed: Walk around often. Continue to take deep breaths and cough in order to clear your  lungs. Keep using the incentive spirometer until your health care provider says it is okay to stop using it. If you have been in the hospital, you may be told to keep using the spirometer at home. Contact a health care provider if: You are having difficulty using the spirometer. You have trouble using the spirometer as often as instructed. Your pain medicine is not giving enough relief for you to use the spirometer as told. You have a fever. Get help right away if: You develop shortness of breath. You develop a cough with bloody mucus from the lungs. You have fluid or blood coming from an incision site after you cough. Summary An incentive spirometer is a tool that can help you learn to take long, deep breaths to keep your lungs clear and active. You may be asked to use a spirometer after a surgery, if you have a lung problem or a history of smoking, or if you have been inactive for a long period of time. Use your incentive spirometer as instructed every 1-2 hours while you are awake. If you have an incision on your chest or abdomen, place a pillow or a rolled-up towel firmly against your incision when you cough. This will help to reduce pain. Get help right away if you have shortness of breath, you cough up bloody mucus, or blood comes from your incision when you cough. This information is not intended to replace advice given to you by your health care provider. Make sure you discuss any questions you have with your health care provider. Document Revised: 07/25/2019 Document Reviewed: 07/25/2019 Elsevier Patient Education  2023 [employer].

## 2024-05-23 NOTE — Progress Notes (Signed)
 Last neurology note:    Progress Notes  - documented in this encounter  Vincent Jannett Hering, MD - 04/11/2024 9:00 AM EST Formatting of this note is different from the original. Images from the original note were not included. Ref Provider: Self PCP: Vincent Alm Hail, MD Assessment and Plan:  In most patients we give written parts of assessment and plan to patient under Patient Instructions/After Visit Summary. So some parts are directed to patient.  Dear [name], It was our pleasure to participate in your care. We have typed up brief summary of what we discussed. 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  2. 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 Recent MRI on July 07, 2023, showed no significant change in size. Considered inoperable with neurosurgery follow-up every two years. Discussed potential risks of seizures and stroke.  3. Mild cognitive impairment with worsening memory Worsening memory with increased short-term and long-term memory changes. No hallucinations or delusions. Getting lost while driving. Negative ATN panel in September 2023. SLUMS score was 19/30 in January 2024. 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 Underlying dementia appears to be worsening. Memantine  dose is currently 5 mg once daily.  - Increased memantine  to 10 mg twice daily. - Encouraged brain exercises and activities that stimulate neuroplasticity, such as creative activities with hands. - Discussed dietary changes including increased intake of blueberries, blackberries, flax seeds, nuts, and vegetables. - Provided information on intermittent fasting with guidance. - Encouraged regular physical activity and exercise. Intermittent fasting 16 hours a day (with lots of water  and other liquids  free of calories/artificial sweetners) and moderate eating 8 hours a day, can help sensitize you to your own insulin, improving blood sugars, prompting weight loss. For more information search for videos by Dr. Selinda Fall or book The Complete Guide to Fasting. motivationaltutor.fr  Look into this website, http://www.perez.com/ for online, interactive games that help stimulate your brain.  4. 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 Not operable for neurosurgery.  - Will repeat MRI of the brain.  5. Generalized sensory peripheral neuropathy No specific discussion or changes in management during this visit.  6. Obstructive sleep apnea on CPAP with persistent daytime sleepiness Persistent daytime sleepiness despite CPAP use. No snoring reported. CPAP usage is inconsistent with missed days. No new medications started that could contribute to sleepiness. Discussed potential causes including poor quality sleep, depression, and underlying dementia. CPAP data not reviewed.  - Encouraged consistent use of CPAP and checking CPAP data via app to ensure effectiveness. - Discussed potential causes of daytime sleepiness including poor sleep quality, depression, and underlying dementia.  Follow up in 6 months with Vincent Stallion, FNP-BC Return in about 6 months (around 10/09/2024) for Vincent Broody NP. I spent a total of 31 minutes in both face-to-face and non-face-to-face activities, excluding procedures performed, for this visit on the date of this encounter. Interim History date 04/11/2024  [name] is a 74 y.o. male here for treatment and evaluation of Memory Loss, accompanied by loved one.  [name] last visit was on 10/06/2023  Patient states he underwent knee replacement surgery 02/01/2024 and had a mechanical fall while walking up his stairs. He expressed that he did not sustain any serious injuries and he did  not hit his head.  Per loved  one, patient is sleeping more and she has a hard time waking him up. Patient does noticed him taking unintentional afternoon naps, he describe himself dozing off if sitting for long periods. Loved one expressed that she has noticed that he does not have as much energy as before. Loved one states that she has noticed changes in patients long and short term memory. He denies any hallucinations, delusions, getting lost while driving, or confusion.  History of Present Illness Vincent Patton is a 74 year old male with mild cognitive impairment and meningioma who presents for follow-up. He is accompanied by his wife.  Cognitive impairment and memory changes - Mild cognitive impairment with worsening short-term and long-term memory - No hallucinations, delusions, episodes of confusion, or getting lost while driving - SLUM score was 80/69 on May 23, 2022  Somnolence and fatigue - Increased sleepiness with prolonged sleep duration and difficulty waking up - Unintentional afternoon naps and dozing off when sitting for long periods - Decreased energy compared to baseline - No snoring at night - No symptoms of depression - Uses CPAP for sleep apnea but missed use for 2-3 days recently  Intracranial neoplasm - MRI of the brain on July 07, 2023, showed a 3.7 x 2.2 cm meningioma in the right cavernous sinus - Lesion is not operable  Gait instability and fall - Mechanical fall while walking up stairs after knee replacement surgery on February 01, 2024 - No significant injury sustained  Medication adherence and regimen - Currently taking amlodipine , vitamin B12 supplementation, magnesium, and memantine  - Memantine  taken as a whole pill once daily instead of prescribed half pill in the morning and half at night - No recent changes in medication regimen  Physical activity - Spends 2-3 hours outside in nature daily, depending on wife's activities  I reviewed labs,  imaging, and notes in [place], [place], and from outside providers, if available.  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 is a right handed 74 y.o. male here for evaluation of Memory Loss , referred by Self.  Constant dizziness fluctuating in intensity in patient with visuospatial disorientation, anosmia, slowed gait, constipation, short-term memory challenges (forgetting appointments, forgetting he already turned the stove off, getting thoughts together), and family history of dementia (mother, uncle, cousin) - concerning for underlying neurodegenerative disorder/dementia, E3/E4 positive: Patient reports his dizziness began in 08/2021. He states it is constant, but worse in the morning and improves in the afternoon. He denies positional triggers. Symptoms exacerbated by looking at phone if scrolling and it stops suddenly. He also notes constant bilateral tinnitus. He has been seen by ENT (Dr. Herminio) with negative evaluation for Benign Positional Paroxysmal Vertigo, negative cold caloric, negative VNG per Dr. Herminio. Patient was started on Physical Therapy for dizziness with slight improvement, noting his balance is better. He has been tried on Meclizine for a few days without improvement. Patient reports recent fall, during which he slipped and fell down a bank, denies head injury.  He denies severe dizziness, headaches, starting new medication prior to onset of symptoms, major life event/stressful situation. He denies changes in vision currently, though he has history of double vision which per opthalmology was due to weak extraocular muscles. Symptoms resolved with Physical Therapy. Negative evaluation for Benign Positional Paroxysmal Vertigo, cold caloric and VNG negative with ENT (Dr. Herminio). Currently getting vestibular rehab physical therapy.  Patient also  reports  difficulty gathering his thought in the morning. Per patient's wife, his short-term memory has decreased slightly. He has difficulty remembering appointments. Notes word finding difficulties. Needs some assistance with financial management. No reported driving safety concerns. He also reports problems with depth perception. Patient states he has had significant constipation for the past month (September 2023). He has positive family history of dementia (uncle, cousin, mother - per wife). Previously sent referral to Speech Therapy for cognitive training.  SLUMS 05/23/2022 - 19/30  MRI brain without contrast with NeuroQuant 02/24/2022 - No acute infarction. NeuroQuant study shows normal brain volume without evidence of generalized or lobar specific atrophy. Meningioma of the greater wing of the sphenoid on the right measuring approximately 3 cm in size with involvement of the right cavernous sinus. See above discussion.  Medications: Namenda  (dizziness/diarrhea at higher dose) Acetylcholinesterase inhibitors are not recommended secondary to bradycardia.  Hypovitaminosis - Vitamin B12 and Vitamin D  Meningioma - 3 cm in size with involvement of the right cavernous sinus MRI Brain with and without contrast 06/12/2022: Meningioma centered in the region of the right cavernous sinus, right Meckel's cave, right petroclinoid ligament/tentorium and medial aspect of the right middle cranial fossa as detailed. The dominant component of the mass measures 3.6 x 2.8 x 2.4 cm. This is stable from the prior brain MRI of 02/24/2022. Possible trace parenchymal edema within the medial right temporal lobe adjacent to the mass.  Mild cognitive impairment Mild cognitive impairment with visuospatial disorientation, slow gait, constipation, short-term memory challenges, and family history of dementia. Negative ATN panel in 2023.  Medications: memantine   Generalized sensory motor polyneuropathy Generalized sensory  motor polyneuropathy confirmed by EMG with lower extremity tingling.  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 Not operable for neurosurgery.  Obstructive sleep apnea on CPAP with persistent daytime sleepiness Persistent daytime sleepiness despite CPAP use. No snoring reported. CPAP usage is inconsistent with missed days. No new medications started that could contribute to sleepiness.  Physical Exam  Vitals Vitals: 04/11/24 0900 BP: 136/84 Pulse: 65 SpO2: 98% Weight: 80.7 kg (178 lb) Height: 182.9 cm (6') PainSc: 0-No pain  Body mass index is 24.14 kg/m.  (Some of the exam changes noted are from previous clinical observations)  Physical Exam  General Exam Chronic left blepharoptosis Right sided facial weakness  Neurological Exam 08/26/2022: Immediate recall (registration) 3/3 and delayed recall 2/3. [name] was asked to count months of the year backward - Patient was able to do it without any problems.  [name] was given a paper with big circle drawn on it, and the patient was asked to assume it is a clock - put hour numbers on it and draw a specific time - patient received 1/4 points on Clock Drawing Task.  12/26/2022 K words - 4  Medications: Current Outpatient Medications on File Prior to Visit Medication Sig Dispense Refill amLODIPine  (NORVASC ) 5 MG tablet TAKE 1 TABLET BY MOUTH DAILY 90 tablet 3 aspirin  81 MG EC tablet Take 81 mg by mouth once daily cholecalciferol 1000 unit tablet Take 1,000 Units by mouth once daily cyanocobalamin (VITAMIN B12) 1000 MCG tablet Take 1,000 mcg by mouth once daily magnesium gluconate (MAG-G) 27 mg (500 mg) tablet Take 500 mg by mouth once daily pantoprazole  (PROTONIX ) 40 MG DR tablet Take 1 tablet (40 mg total) by mouth once daily 90 tablet 3 potassium chloride (KLOR-CON M10) 10 mEq ER tablet Take 10 mEq by mouth 2 (  two) times daily timoloL  maleate (TIMOPTIC )  0.5 % ophthalmic solution Place 1 drop into both eyes 2 (two) times daily  No current facility-administered medications on file prior to visit.  Past Medical History: Past Medical History: Diagnosis Date Actinic keratoses Arthritis Genital herpes Glaucoma (increased eye pressure) History of prostate cancer History of squamous cell carcinoma Raynaud phenomenon  Past Surgical History: Past Surgical History: Procedure Laterality Date TONSILLECTOMY 1960 KNEE ARTHROSCOPY Right 1979 Right knee s/p torn cartilage KNEE ARTHROSCOPY Right 1983 Right knee s/p torn cartilage COLONOSCOPY 11/10/2001 Adenomatous Polyps, FH Colon Polyps (Mother); CBF 04/2015; Recall Ltr mailed 03/28/2015 (dw) LEG SKIN LESION BIOPSY / EXCISION 10/2004 Squamous cell carcinoma HERNIA REPAIR Right 06/24/2005 Right inguinal PROSTATECTOMY PERINEAL 06/2008 HERNIA REPAIR Left 2011 Left inguinal COLONOSCOPY 05/29/2015 PH Adenomatous Polyps, FH Colon Polyps (Mother): CBF 05/2020 COLONOSCOPY 08/14/2020 Normal colon/PHx CP/Repeat 66yrs/TKT EGD 08/14/2020 Barrett's Esophagus/Erosive esophagitis/Repeat 27yrs/TKT EGD@[facility]  11/13/2023 Barretts/Rpt69yrs/TKT ARTHROPLASTY TOTAL KNEE Left 02/01/2024 By Dr. Lorelle ARTHROPLASTY TOTAL KNEE Left 02/01/2024 By Dr. Lorelle  Family History: Family History Problem Relation Name Age of Onset High blood pressure (Hypertension) Mother Diabetes type II Mother Depression Mother Colon polyps Mother Stroke Mother High blood pressure (Hypertension) Father Myocardial Infarction (Heart attack) Father s/p CABG Lung cancer Father Stroke Father  Social History: Social History  Socioeconomic History Marital status: Married Tobacco Use Smoking status: Former Current packs/day: 0.00 Average packs/day: 1 pack/day for 15.0 years (15.0 ttl pk-yrs) Types: Cigarettes Start date: 01/24/1963 Quit date: 01/23/1978 Years since quitting: 46.2 Smokeless tobacco: Never Vaping Use Vaping  status: Unknown Substance and Sexual Activity Alcohol use: Yes Alcohol/week: 2.0 standard drinks of alcohol Types: 1 Glasses of wine, 1 Cans of beer per week Drug use: No Sexual activity: Yes  Social Drivers of Catering Manager Strain: Low Risk (03/22/2024) Overall Financial Resource Strain (CARDIA) Difficulty of Paying Living Expenses: Not hard at all Food Insecurity: No Food Insecurity (03/22/2024) Hunger Vital Sign Worried About Running Out of Food in the Last Year: Never true Ran Out of Food in the Last Year: Never true Transportation Needs: No Transportation Needs (03/22/2024) PRAPARE - Contractor (Medical): No Lack of Transportation (Non-Medical): No Housing Stability: Low Risk (03/22/2024) Housing Stability Vital Sign Unable to Pay for Housing in the Last Year: No Number of Times Moved in the Last Year: 0 Homeless in the Last Year: No  Allergies: Allergies Allergen Reactions Flomax [Tamsulosin] Shortness Of Breath and Other (See Comments) Feels like he will pass out while on it with SOB Lodine [Etodolac] Anxiety Depression, ulcers in mouth and suicidal thoughts.  Dr. Jannett Fairly  Electronically signed by Fairly Jannett Hering, MD at 04/15/2024 8:23 AM EST

## 2024-05-27 NOTE — Anesthesia Preprocedure Evaluation (Signed)
 "                                  Anesthesia Evaluation  Patient identified by MRN, date of birth, ID band Patient awake    Reviewed: Allergy & Precautions, NPO status , Patient's Chart, lab work & pertinent test results  History of Anesthesia Complications Negative for: history of anesthetic complications  Airway Mallampati: III  TM Distance: <3 FB Neck ROM: full    Dental  (+) Chipped   Pulmonary neg shortness of breath, sleep apnea and Continuous Positive Airway Pressure Ventilation , former smoker   Pulmonary exam normal        Cardiovascular Exercise Tolerance: Good hypertension, + CAD  Normal cardiovascular exam+ dysrhythmias (iRBBB)    Coronary CTA was performed on 08/01/2021 that demonstrated an Agatston coronary artery calcium score of 352. This placed patient in the 66th percentile for age, sex, and race matched controls. Calcium depositions noted to be isolated mainly in the proximal-mid LAD (50%) and proximal RCA/LCx (25%) distributions.  Study demonstrated normal coronary origin with RIGHT dominance.  Study was sent for FFR CT analysis (ranges: < 0.75 high likelihood of hemodynamically significant stenosis, 0.76-0.80 borderline, > 0.80 normal):    Left Main:  No significant stenosis.  LAD: No significant stenosis.  FFRct 0.87  LCX: No significant stenosis.  FFRct 0.90  RCA: No significant stenosis.  FFRct 0.86    Myocardial perfusion imaging study performed on 06/03/2023 revealed a normal left ventricular systolic function with an EF of 62%.  There was no evidence of stress-induced myocardial ischemia or arrhythmia; no scintigraphic evidence of scar. TID ratio = 0.95 (normal range </= 1.2).  Study was determined to be normal and low risk.    Most recent TTE performed on 12/23/2023 revealed a normal left ventricular systolic function with an EF of >55 %. There was mild LVH.  There were no regional wall motion abnormalities. Left ventricular diastolic  Doppler parameters consistent with pseudonormalization (G2DD). Right ventricular size and function normal with a TAPSE measuring 2.5 cm  (normal range >/= 1.6 cm).  RVSP = 35.0 mmHg consistent with mild pulmonary hypertension.  There was mild pan valvular regurgitation. All transvalvular gradients were noted to be normal providing no evidence of hemodynamically significant valvular stenosis. Aorta normal in size with no evidence of ectasia or aneurysmal dilatation.      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 Not operable for neurosurgery.    Neuromuscular disease (Generalized sensory peripheral neuropathy)    GI/Hepatic Neg liver ROS, hiatal hernia, PUD,GERD  Controlled,,  Endo/Other  negative endocrine ROS    Renal/GU      Musculoskeletal   Abdominal Normal abdominal exam  (+)   Peds  Hematology negative hematology ROS (+)   Anesthesia Other Findings Past Medical History: No date: Actinic keratosis No date: Aortic atherosclerosis (HCC) No date: Barrett's esophagus without dysplasia No date: Bilateral hydrocele No date: Bilateral leg numbness No date: Bradycardia No date: CAD (coronary artery disease) No date: Cerebral microvascular disease No date: Cognitive impairment     Comment:  a.) on NMDA receptor antagonist (memantine ) No date: Diastolic dysfunction No date: Dizziness No date: DOE (dyspnea on exertion)  No date: Erectile dysfunction     Comment:  a.) on PDE5i (sildenafil) No date: Erosive esophagitis No date: Genital herpes No date: GERD (gastroesophageal reflux disease) No date: Glaucoma No date: H/O bilateral inguinal hernia repair No date: Hepatic cyst No date: Hiatal hernia 2010: History of  prostate cancer s/p prostatectomy No date: Hx of adenomatous colonic polyps No date: Hyperlipidemia, mixed No date: Hypertension, essential No date: Long-term use of aspirin  therapy No date: Lumbar facet arthropathy No date: Lumbar spondylosis     Comment:  a.) s/p RIGHT far lateral metrx hemilaminectomy and               discectomy (L4-L5) 11/24/2018 No date: Meningioma (HCC) No date: Multiple pulmonary nodules determined by computed tomography  of lung No date: Nephrolithiasis No date: OSA on CPAP No date: Pedal edema No date: Primary osteoarthritis of left knee No date: Pulmonary hypertension (HCC) No date: Raynaud phenomenon No date: Renal cyst, left No date: Squamous cell skin cancer  Past Surgical History: No date: CARPAL TUNNEL RELEASE; Right No date: ESOPHAGOGASTRODUODENOSCOPY No date: INGUINAL HERNIA REPAIR; Bilateral 1983: knee arthoscopy ; Right     Comment:  torn cartlage 1979: KNEE ARTHROSCOPY; Right     Comment:  torn cartlage 10/2004: leg skin lesion biopsy / lesion     Comment:  squamous cell carcinoma 06/2008: PROSTATECTOMY 11/24/2018: RIGHT FAR LATERAL METRX HEMILAMINECTOMY AND DISCECTOMY;  Right     Comment:  Procedure: RIGHT FAR LATERAL METRX HEMILAMINECTOMY AND               DISCECTOMY; Location: [facility] No date: tear ducts surgery      Comment:  as a child 1960: TONSILLECTOMY     Reproductive/Obstetrics negative OB ROS                              Anesthesia Physical Anesthesia Plan  ASA: 3  Anesthesia Plan: General/Spinal   Post-op Pain Management: Regional block* and Ofirmev  IV (intra-op)*   Induction: Intravenous  PONV Risk Score and Plan: 2 and Ondansetron  and Dexamethasone   Airway Management Planned: Natural Airway  Additional Equipment:   Intra-op Plan:   Post-operative Plan:   Informed Consent: I have reviewed the patients History and Physical, chart, labs and discussed the procedure  including the risks, benefits and alternatives for the proposed anesthesia with the patient or authorized representative who has indicated his/her understanding and acceptance.     Dental Advisory Given  Plan Discussed with: Anesthesiologist, CRNA and Surgeon  Anesthesia Plan Comments:          Anesthesia Quick Evaluation  "

## 2024-05-30 ENCOUNTER — Encounter: Payer: Self-pay | Admitting: Orthopedic Surgery

## 2024-05-30 ENCOUNTER — Ambulatory Visit: Payer: Self-pay | Admitting: Anesthesiology

## 2024-05-30 ENCOUNTER — Ambulatory Visit

## 2024-05-30 ENCOUNTER — Encounter: Admission: RE | Payer: Self-pay | Source: Home / Self Care

## 2024-05-30 ENCOUNTER — Ambulatory Visit
Admission: RE | Admit: 2024-05-30 | Discharge: 2024-05-31 | Disposition: A | Attending: Orthopedic Surgery | Admitting: Orthopedic Surgery

## 2024-05-30 ENCOUNTER — Other Ambulatory Visit: Payer: Self-pay

## 2024-05-30 DIAGNOSIS — M1711 Unilateral primary osteoarthritis, right knee: Secondary | ICD-10-CM | POA: Insufficient documentation

## 2024-05-30 DIAGNOSIS — D420 Neoplasm of uncertain behavior of cerebral meninges: Secondary | ICD-10-CM | POA: Insufficient documentation

## 2024-05-30 DIAGNOSIS — K449 Diaphragmatic hernia without obstruction or gangrene: Secondary | ICD-10-CM | POA: Insufficient documentation

## 2024-05-30 DIAGNOSIS — I2510 Atherosclerotic heart disease of native coronary artery without angina pectoris: Secondary | ICD-10-CM | POA: Insufficient documentation

## 2024-05-30 DIAGNOSIS — Z8711 Personal history of peptic ulcer disease: Secondary | ICD-10-CM | POA: Insufficient documentation

## 2024-05-30 DIAGNOSIS — G4733 Obstructive sleep apnea (adult) (pediatric): Secondary | ICD-10-CM | POA: Insufficient documentation

## 2024-05-30 DIAGNOSIS — Z87891 Personal history of nicotine dependence: Secondary | ICD-10-CM | POA: Insufficient documentation

## 2024-05-30 DIAGNOSIS — Z96651 Presence of right artificial knee joint: Secondary | ICD-10-CM

## 2024-05-30 DIAGNOSIS — Z8249 Family history of ischemic heart disease and other diseases of the circulatory system: Secondary | ICD-10-CM | POA: Insufficient documentation

## 2024-05-30 DIAGNOSIS — K219 Gastro-esophageal reflux disease without esophagitis: Secondary | ICD-10-CM | POA: Diagnosis not present

## 2024-05-30 DIAGNOSIS — I10 Essential (primary) hypertension: Secondary | ICD-10-CM | POA: Insufficient documentation

## 2024-05-30 DIAGNOSIS — G608 Other hereditary and idiopathic neuropathies: Secondary | ICD-10-CM | POA: Insufficient documentation

## 2024-05-30 HISTORY — PX: TOTAL KNEE ARTHROPLASTY: SHX125

## 2024-05-30 MED ORDER — LACTATED RINGERS IV SOLN: INTRAVENOUS | Status: DC

## 2024-05-30 MED ORDER — ASPIRIN 81 MG PO CHEW
81.0000 mg | CHEWABLE_TABLET | Freq: Every day | ORAL | Status: DC
  Administered 2024-05-31: 81 mg via ORAL
  Filled 2024-05-30: qty 1

## 2024-05-30 MED ORDER — CEFAZOLIN SODIUM-DEXTROSE 2-4 GM/100ML-% IV SOLN
2.0000 g | INTRAVENOUS | Status: AC
  Administered 2024-05-30: 2 g via INTRAVENOUS

## 2024-05-30 MED ORDER — PHENYLEPHRINE HCL-NACL 20-0.9 MG/250ML-% IV SOLN
INTRAVENOUS | Status: DC | PRN
  Administered 2024-05-30: 25 ug/min via INTRAVENOUS

## 2024-05-30 MED ORDER — ENOXAPARIN SODIUM 40 MG/0.4ML IJ SOSY
40.0000 mg | PREFILLED_SYRINGE | INTRAMUSCULAR | 0 refills | Status: AC
  Filled 2024-05-30: qty 5.6, 14d supply, fill #0

## 2024-05-30 MED ORDER — CEFAZOLIN SODIUM-DEXTROSE 2-4 GM/100ML-% IV SOLN
2.0000 g | Freq: Four times a day (QID) | INTRAVENOUS | Status: AC
  Administered 2024-05-30: 2 g via INTRAVENOUS
  Filled 2024-05-30 (×2): qty 100

## 2024-05-30 MED ORDER — ACETAMINOPHEN 10 MG/ML IV SOLN: 1000.0000 mg | Freq: Once | INTRAVENOUS | Status: DC | PRN

## 2024-05-30 MED ORDER — OXYCODONE HCL 5 MG/5ML PO SOLN: 5.0000 mg | Freq: Once | ORAL | Status: DC | PRN

## 2024-05-30 MED ORDER — CEFAZOLIN SODIUM-DEXTROSE 2-4 GM/100ML-% IV SOLN
INTRAVENOUS | Status: AC
  Filled 2024-05-30: qty 100

## 2024-05-30 MED ORDER — TRAMADOL HCL 50 MG PO TABS: 50.0000 mg | ORAL_TABLET | Freq: Four times a day (QID) | ORAL | Status: DC | PRN

## 2024-05-30 MED ORDER — ACETAMINOPHEN 500 MG PO TABS
1000.0000 mg | ORAL_TABLET | Freq: Three times a day (TID) | ORAL | Status: DC
  Administered 2024-05-30 – 2024-05-31 (×3): 1000 mg via ORAL
  Filled 2024-05-30 (×3): qty 2

## 2024-05-30 MED ORDER — CHLORHEXIDINE GLUCONATE 0.12 % MT SOLN
OROMUCOSAL | Status: AC
  Filled 2024-05-30: qty 15

## 2024-05-30 MED ORDER — TRAMADOL HCL 50 MG PO TABS
50.0000 mg | ORAL_TABLET | Freq: Four times a day (QID) | ORAL | 0 refills | Status: AC | PRN
  Filled 2024-05-30: qty 30, 8d supply, fill #0

## 2024-05-30 MED ORDER — SODIUM CHLORIDE 0.9 % IV SOLN: INTRAVENOUS | Status: DC

## 2024-05-30 MED ORDER — DEXAMETHASONE SOD PHOSPHATE PF 10 MG/ML IJ SOLN
8.0000 mg | Freq: Once | INTRAMUSCULAR | Status: AC
  Administered 2024-05-30: 10 mg via INTRAVENOUS

## 2024-05-30 MED ORDER — BUPIVACAINE HCL (PF) 0.5 % IJ SOLN
INTRAMUSCULAR | Status: DC | PRN
  Administered 2024-05-30: 2.8 mg

## 2024-05-30 MED ORDER — DROPERIDOL 2.5 MG/ML IJ SOLN: 0.6250 mg | Freq: Once | INTRAMUSCULAR | Status: DC | PRN

## 2024-05-30 MED ORDER — ENOXAPARIN SODIUM 30 MG/0.3ML IJ SOSY
30.0000 mg | PREFILLED_SYRINGE | Freq: Two times a day (BID) | INTRAMUSCULAR | Status: DC
  Administered 2024-05-31: 30 mg via SUBCUTANEOUS
  Filled 2024-05-30: qty 0.3

## 2024-05-30 MED ORDER — HYDROCODONE-ACETAMINOPHEN 5-325 MG PO TABS: 1.0000 | ORAL_TABLET | ORAL | Status: DC | PRN

## 2024-05-30 MED ORDER — METOCLOPRAMIDE HCL 5 MG/ML IJ SOLN: 5.0000 mg | Freq: Three times a day (TID) | INTRAMUSCULAR | Status: DC | PRN

## 2024-05-30 MED ORDER — FENTANYL CITRATE (PF) 100 MCG/2ML IJ SOLN: 25.0000 ug | INTRAMUSCULAR | Status: DC | PRN

## 2024-05-30 MED ORDER — FENTANYL CITRATE (PF) 100 MCG/2ML IJ SOLN
INTRAMUSCULAR | Status: DC | PRN
  Administered 2024-05-30: 25 ug via INTRAVENOUS

## 2024-05-30 MED ORDER — SODIUM CHLORIDE (PF) 0.9 % IJ SOLN
INTRAMUSCULAR | Status: AC
  Filled 2024-05-30: qty 20

## 2024-05-30 MED ORDER — BUPIVACAINE-EPINEPHRINE (PF) 0.25% -1:200000 IJ SOLN
INTRAMUSCULAR | Status: AC
  Filled 2024-05-30: qty 30

## 2024-05-30 MED ORDER — AMLODIPINE BESYLATE 10 MG PO TABS
5.0000 mg | ORAL_TABLET | ORAL | Status: DC
  Administered 2024-05-31: 5 mg via ORAL

## 2024-05-30 MED ORDER — ACETAMINOPHEN 500 MG PO TABS
1000.0000 mg | ORAL_TABLET | Freq: Three times a day (TID) | ORAL | 0 refills | Status: AC
  Filled 2024-05-30: qty 30, 5d supply, fill #0

## 2024-05-30 MED ORDER — MEMANTINE HCL 5 MG PO TABS
10.0000 mg | ORAL_TABLET | Freq: Two times a day (BID) | ORAL | Status: DC
  Administered 2024-05-30 – 2024-05-31 (×2): 10 mg via ORAL
  Filled 2024-05-30 (×2): qty 2

## 2024-05-30 MED ORDER — TRANEXAMIC ACID-NACL 1000-0.7 MG/100ML-% IV SOLN
INTRAVENOUS | Status: AC
  Filled 2024-05-30: qty 100

## 2024-05-30 MED ORDER — TIMOLOL MALEATE 0.5 % OP SOLN
1.0000 [drp] | Freq: Two times a day (BID) | OPHTHALMIC | Status: DC
  Administered 2024-05-30 – 2024-05-31 (×2): 1 [drp] via OPHTHALMIC
  Filled 2024-05-30: qty 5

## 2024-05-30 MED ORDER — CHLORHEXIDINE GLUCONATE 0.12 % MT SOLN
15.0000 mL | Freq: Once | OROMUCOSAL | Status: AC
  Administered 2024-05-30: 15 mL via OROMUCOSAL

## 2024-05-30 MED ORDER — PROPOFOL 10 MG/ML IV BOLUS
INTRAVENOUS | Status: DC | PRN
  Administered 2024-05-30: 100 ug/kg/min via INTRAVENOUS

## 2024-05-30 MED ORDER — OXYCODONE HCL 5 MG PO TABS
2.5000 mg | ORAL_TABLET | Freq: Four times a day (QID) | ORAL | 0 refills | Status: AC | PRN
  Filled 2024-05-30: qty 28, 7d supply, fill #0

## 2024-05-30 MED ORDER — ACETAMINOPHEN 10 MG/ML IV SOLN
INTRAVENOUS | Status: AC
  Filled 2024-05-30: qty 100

## 2024-05-30 MED ORDER — ACETAMINOPHEN 10 MG/ML IV SOLN
INTRAVENOUS | Status: DC | PRN
  Administered 2024-05-30: 1000 mg via INTRAVENOUS

## 2024-05-30 MED ORDER — ONDANSETRON HCL 4 MG PO TABS
4.0000 mg | ORAL_TABLET | Freq: Four times a day (QID) | ORAL | 0 refills | Status: AC | PRN
  Filled 2024-05-30: qty 20, 5d supply, fill #0

## 2024-05-30 MED ORDER — TRANEXAMIC ACID-NACL 1000-0.7 MG/100ML-% IV SOLN
1000.0000 mg | INTRAVENOUS | Status: AC
  Administered 2024-05-30 (×2): 1000 mg via INTRAVENOUS

## 2024-05-30 MED ORDER — PROPOFOL 1000 MG/100ML IV EMUL
INTRAVENOUS | Status: AC
  Filled 2024-05-30: qty 100

## 2024-05-30 MED ORDER — FENTANYL CITRATE (PF) 100 MCG/2ML IJ SOLN
INTRAMUSCULAR | Status: AC
  Filled 2024-05-30: qty 2

## 2024-05-30 MED ORDER — OXYCODONE HCL 5 MG PO TABS: 5.0000 mg | ORAL_TABLET | Freq: Once | ORAL | Status: DC | PRN

## 2024-05-30 MED ORDER — PROPOFOL 10 MG/ML IV BOLUS
INTRAVENOUS | Status: AC
  Filled 2024-05-30: qty 20

## 2024-05-30 MED ORDER — SODIUM CHLORIDE (PF) 0.9 % IJ SOLN
INTRAMUSCULAR | Status: DC | PRN
  Administered 2024-05-30: 70 mL

## 2024-05-30 MED ORDER — BUPIVACAINE LIPOSOME 1.3 % IJ SUSP
INTRAMUSCULAR | Status: AC
  Filled 2024-05-30: qty 20

## 2024-05-30 MED ORDER — ONDANSETRON HCL 4 MG/2ML IJ SOLN: 4.0000 mg | Freq: Four times a day (QID) | INTRAMUSCULAR | Status: DC | PRN

## 2024-05-30 MED ORDER — PANTOPRAZOLE SODIUM 40 MG PO TBEC
40.0000 mg | DELAYED_RELEASE_TABLET | Freq: Every day | ORAL | Status: DC
  Administered 2024-05-31: 40 mg via ORAL
  Filled 2024-05-30: qty 1

## 2024-05-30 MED ORDER — ORAL CARE MOUTH RINSE: 15.0000 mL | Freq: Once | OROMUCOSAL | Status: AC

## 2024-05-30 MED ORDER — PHENOL 1.4 % MT LIQD: 1.0000 | OROMUCOSAL | Status: DC | PRN

## 2024-05-30 MED ORDER — MORPHINE SULFATE (PF) 4 MG/ML IV SOLN: 0.5000 mg | INTRAVENOUS | Status: DC | PRN

## 2024-05-30 MED ORDER — ONDANSETRON HCL 4 MG/2ML IJ SOLN
INTRAMUSCULAR | Status: DC | PRN
  Administered 2024-05-30: 4 mg via INTRAVENOUS

## 2024-05-30 MED ORDER — ACETAMINOPHEN 325 MG PO TABS: 325.0000 mg | ORAL_TABLET | Freq: Four times a day (QID) | ORAL | Status: DC | PRN

## 2024-05-30 MED ORDER — DOCUSATE SODIUM 100 MG PO CAPS
100.0000 mg | ORAL_CAPSULE | Freq: Two times a day (BID) | ORAL | Status: DC
  Administered 2024-05-30 – 2024-05-31 (×2): 100 mg via ORAL
  Filled 2024-05-30 (×2): qty 1

## 2024-05-30 MED ORDER — ONDANSETRON HCL 4 MG PO TABS: 4.0000 mg | ORAL_TABLET | Freq: Four times a day (QID) | ORAL | Status: DC | PRN

## 2024-05-30 MED ORDER — MENTHOL 3 MG MT LOZG: 1.0000 | LOZENGE | OROMUCOSAL | Status: DC | PRN

## 2024-05-30 MED ORDER — SODIUM CHLORIDE 0.9 % IR SOLN
Status: DC | PRN
  Administered 2024-05-30: 3000 mL

## 2024-05-30 MED ORDER — PHENYLEPHRINE HCL-NACL 20-0.9 MG/250ML-% IV SOLN
INTRAVENOUS | Status: AC
  Filled 2024-05-30: qty 250

## 2024-05-30 MED ORDER — METOCLOPRAMIDE HCL 5 MG PO TABS: 5.0000 mg | ORAL_TABLET | Freq: Three times a day (TID) | ORAL | Status: DC | PRN

## 2024-05-30 MED ORDER — SURGIPHOR WOUND IRRIGATION SYSTEM - OPTIME: TOPICAL | Status: DC | PRN

## 2024-05-30 MED ORDER — DOCUSATE SODIUM 100 MG PO CAPS
100.0000 mg | ORAL_CAPSULE | Freq: Two times a day (BID) | ORAL | 0 refills | Status: AC
  Filled 2024-05-30: qty 20, 10d supply, fill #0

## 2024-05-30 NOTE — Anesthesia Procedure Notes (Addendum)
 Spinal  Start time: 05/30/2024 7:30 AM End time: 05/30/2024 7:38 AM Reason for block: surgical anesthesia  Staffing Performed: resident/CRNA  Authorized by: Quin Annabella RAMAN, MD   Performed by: Lennie Lamarr HERO, CRNA  Preanesthetic Checklist Completed: patient identified, IV checked, site marked, risks and benefits discussed, surgical consent, monitors and equipment checked, pre-op evaluation and timeout performed Spinal Block Patient position: sitting Prep: ChloraPrep Patient monitoring: heart rate, continuous pulse ox and blood pressure Approach: midline Location: L4-5 Injection technique: single-shot Needle Needle type: Pencan  Needle gauge: 24 G Needle length: 10 cm Needle insertion depth (cm): 8 Assessment Sensory level: T10 Events: CSF return  Additional Notes Sterile aseptic technique used throughout the procedure. Negative parasthesia. Negative blood return. Positive free flowing CSF. Expiration date of kit checked and confirmed. Patient tolerated procedure well without complication.

## 2024-05-30 NOTE — Op Note (Signed)
 Patient Name: Vincent Patton  FMW:[id number]  Pre-Operative Diagnosis: Right knee Osteoarthritis  Post-Operative Diagnosis: (same)  Procedure: Right Total Knee Arthroplasty  Components/Implants: Femur: Persona Size 9 CR PPS   Tibia: Persona Size E OsseoTi  Poly: 10mm MC  Patella: 35x9.37mm symmetric  Femoral Valgus Cut Angle: 5 degrees  Distal Femoral Re-cut: none  Patella Resurfacing: yes   Date of Surgery: 05/30/2024  Surgeon: Arthea Sheer MD  Assistant: Debby Amber PA (present and scrubbed throughout the case, critical for assistance with exposure, retraction, instrumentation, and closure)   Anesthesiologist: Quin  Anesthesia: Spinal  Tourniquet Time: 42 min  EBL: 50cc  IVF: 800cc  Complications: None   Brief history: The patient is a 74 year old male with a history of osteoarthritis of the right knee with pain limiting their range of motion and activities of daily living, which has failed multiple attempts at conservative therapy.  The risks and benefits of total knee arthroplasty as definitive surgical treatment were discussed with the patient, who opted to proceed with the operation.  After outpatient medical clearance and optimization was completed the patient was admitted [facility] for the procedure.  All preoperative films were reviewed and an appropriate surgical plan was made prior to surgery. Preoperative range of motion was 0 to 100.   Description of procedure: The patient was brought to the operating room where laterality was confirmed by all those present to be the right side.   Spinal anesthesia was administered and the patient received an intravenous dose of antibiotics for surgical prophylaxis and a dose of tranexamic acid .  Patient is positioned supine on the operating room table with all bony prominences well-padded.  A well-padded tourniquet was applied to the right thigh.  The knee was then prepped and draped in usual sterile  fashion with multiple layers of adhesive and nonadhesive drapes.  All of those present in the operating room participated in a surgical timeout laterality and patient were confirmed.   An Esmarch was wrapped around the extremity and the leg was elevated and the knee flexed.  The tourniquet was inflated to a pressure of 250 mmHg. The Esmarch was removed and the leg was brought down to full extension.  The patella and tibial tubercle identified and outlined using a marking pen and a midline skin incision was made with a knife carried through the subcutaneous tissue down to the extensor retinaculum.  After exposure of the extensor mechanism the medial parapatellar arthrotomy was performed with a scalpel and electrocautery extending down medial and distal to the tibial tubercle taking care to avoid incising the patellar tendon.   A standard medial release was performed over the proximal tibia.  The knee was brought into extension in order to excise the fat pad taking care not to damage the patella tendon.  The superior soft tissue was removed from the anterior surface of the distal femur to visualize for the procedure.  The knee was then brought into flexion with the patella subluxed laterally and subluxing the tibia anteriorly.  The ACL was transected and removed with electrocautery and additional soft tissue was removed from the proximal surface of the tibia to fully expose. The PCL was found to be intact and was preserved.  An extramedullary tibial cutting guide was then applied to the leg with a spring-loaded ankle clamp placed around the distal tibia just above the malleoli the angulation of the guide was adjusted to give some posterior slope in the tibial resection with an appropriate varus/valgus  alignment.  The resection guide was then pinned to the proximal tibia and the proximal tibial surface was resected with an oscillating saw.  Careful attention was paid to ensure the blade did not disrupt any of the  soft tissues including any lateral or medial ligament.  Attention was then turned to the femur, with the knee slightly flexed a opening drill was used to enter the medullary canal of the femur.  After removing the drill marrow was suctioned out to decompress the distal femur.  An intramedullary femoral guide was then inserted into the drill hole and the alignment guide was seated firmly against the distal end of the medial femoral condyle.  The distal femoral cutting guide was then attached and pinned securely to the anterior surface of the femur and the intramedullary rod and alignment guide was removed.  Distal femur resection was then performed with an oscillating saw with retractors protecting medial and laterally.   The distal cutting block was then removed and the extension gap was checked with a spacer.  Extension gap was found to be appropriately sized to accommodate the spacer block.  The femoral sizing guide was then placed securely into the posterior condyles of the femur and the femoral size was measured and determined to be 9.  The size 9; 4-in-1 cutting guide was placed in position and secured with 2 pins.  The anterior posterior and chamfer resections were then performed with an oscillating saw.  Bony fragments and osteophytes were then removed.  Using a lamina spreader the posterior medial and lateral condyles were checked for additional osteophytes and posterior soft tissue remnants.  Any remaining meniscus was removed at this time.  Periarticular injection was performed in the meniscal rims and posterior capsule with aspiration performed to ensure no intravascular injection.   The tibia was then exposed and the tibial trial was pinned onto the plateau after confirming appropriate orientation and rotation.  Using the drill bushing the tibia was prepared to the appropriate drill depth.  Tibial broach impactor was then driven through the punch guide using a mallet.  The femoral trial component  was then inserted onto the femur. A trial tibial polyethylene bearing was then placed and the knee was reduced.  The knee achieved full extension with no hyperextension and was found to be balanced in flexion and extension with the trials in place.  The knee was then brought into full extension the patella was everted and held with 2 Kocher clamps.  The articular surface of the patella was then resected with an patella reamer and saw after careful measurement with a caliper.  The patella was then prepared with the drill guide and a trial patella was placed.  The knee was then taken through range of motion and it was found that the patella articulated appropriately with the trochlea and good patellofemoral motion without subluxation.    The correct final components for implantation were confirmed and opened by the circulator nurse.  The knee was irrigated with normal saline via pulsatile lavage to remove any bony debris or soft tissue.  The prepared surface of the tibia was exposed and the tibial component was implanted with good bony contact.  The femoral component was then placed and impacted showing good coverage and a good snug fit.  The patella was then cleared off and the patella compression tool was used to apply the patellar component with symmetric compression onto the patella.  The tibial component was then irrigated and cleared of any debris  and a real polyethylene component was placed and engaged with the locking mechanism.  The knee was then injected with the particular cocktail.  The knee was taken through range of motion and found to be stable in flexion and extension with patellar tracking.  The knee was then irrigated with copious amount of normal saline via pulsatile lavage to remove all loose bodies and other debris.  The knee was then irrigated with surgiphor betadine based wash and reirrigated with saline.  The tourniquet was then dropped and all bleeding vessels were identified and  coagulated.  The arthrotomy was approximated with #1 Vicryl and closed with #1 Stratafix suture.  The knee was brought into slight flexion and the subcutaneous tissues were closed with 0 Vicryl, 2-0 Vicryl and a running subcuticular 4-0 stratafix barbed suture.  Skin was then glued with Dermabond.  A sterile adhesive dressing was then placed along with a sequential compression device to the calf, a Ted stocking, and a cryotherapy cuff.   Sponge, needle, and Lap counts were all correct at the end of the case.   The patient was transferred off of the operating room table to a hospital bed, good pulses were found distally on the operative side.  The patient was transferred to the recovery room in stable condition.

## 2024-05-30 NOTE — Transfer of Care (Signed)
 Immediate Anesthesia Transfer of Care Note  Patient: Vincent Patton  Procedure(s) Performed: ARTHROPLASTY, KNEE, TOTAL (Right: Knee)  Patient Location: PACU  Anesthesia Type:Spinal  Level of Consciousness: drowsy and patient cooperative  Airway & Oxygen Therapy: Patient Spontanous Breathing and Patient connected to face mask oxygen  Post-op Assessment: Report given to RN and Post -op Vital signs reviewed and stable  Post vital signs: Reviewed and stable  Last Vitals:  Vitals Value Taken Time  BP 106/60 05/30/24 09:30  Temp    Pulse 64 05/30/24 09:34  Resp 14 05/30/24 09:34  SpO2 97 % 05/30/24 09:34  Vitals shown include unfiled device data.  Last Pain:  Vitals:   05/30/24 0704  PainSc: 0-No pain         Complications: No notable events documented.

## 2024-05-30 NOTE — Progress Notes (Signed)
 Patient has mobility impairment for daily activities. A rolling walker will resolve this and the patient is safe to use it    T. Medford Amber, PA-C [facility]

## 2024-05-30 NOTE — H&P (Signed)
 History of Present Illness: The patient is an 74 y.o. male seen in clinic today for follow-up evaluation of his right knee. Patient reports he has done well with his left total knee replacement and is here today as he has any ongoing issues and pain with further recovery due to his right knee. He reports intermittent sharp stabbing pains with medial side of the knee which will stop him in his tracks when walking. He reports avoidance of incline walking or going up and down stairs due to severe pain and sensations of instability with those activities. He is here to discuss a more permanent solution for his right knee as he has had a good result with his left total knee replacement. The patient denies fevers, chills, numbness, tingling, shortness of breath, chest pain, recent illness, or any trauma.  Patient is a non-smoker with a BMI of 23.6  Past Medical History: Past Medical History:  Diagnosis Date  Actinic keratoses  Arthritis  Genital herpes  Glaucoma (increased eye pressure)  History of prostate cancer  History of squamous cell carcinoma  Raynaud phenomenon   Past Surgical History: Past Surgical History:  Procedure Laterality Date  TONSILLECTOMY 1960  KNEE ARTHROSCOPY Right 1979  Right knee s/p torn cartilage  KNEE ARTHROSCOPY Right 1983  Right knee s/p torn cartilage  COLONOSCOPY 11/10/2001  Adenomatous Polyps, FH Colon Polyps (Mother); CBF 04/2015; Recall Ltr mailed 03/28/2015 (dw)  LEG SKIN LESION BIOPSY / EXCISION 10/2004  Squamous cell carcinoma  HERNIA REPAIR Right 06/24/2005  Right inguinal  PROSTATECTOMY PERINEAL 06/2008  HERNIA REPAIR Left 2011  Left inguinal  COLONOSCOPY 05/29/2015  PH Adenomatous Polyps, FH Colon Polyps (Mother): CBF 05/2020  COLONOSCOPY 08/14/2020  Normal colon/PHx CP/Repeat 65yrs/TKT  EGD 08/14/2020  Barrett's Esophagus/Erosive esophagitis/Repeat 16yrs/TKT  EGD@[facility]  11/13/2023  Barretts/Rpt7yrs/TKT  ARTHROPLASTY TOTAL KNEE Left 02/01/2024  By  Dr. Lorelle  ARTHROPLASTY TOTAL KNEE Left 02/01/2024  By Dr. Lorelle   Past Family History: Family History  Problem Relation Age of Onset  High blood pressure (Hypertension) Mother  Diabetes type II Mother  Depression Mother  Colon polyps Mother  Stroke Mother  High blood pressure (Hypertension) Father  Myocardial Infarction (Heart attack) Father  s/p CABG  Lung cancer Father  Stroke Father   Medications: Current Outpatient Medications  Medication Sig Dispense Refill  amLODIPine  (NORVASC ) 5 MG tablet TAKE 1 TABLET BY MOUTH DAILY 90 tablet 3  aspirin  81 MG EC tablet Take 81 mg by mouth once daily  cholecalciferol 1000 unit tablet Take 1,000 Units by mouth once daily  cyanocobalamin (VITAMIN B12) 1000 MCG tablet Take 1,000 mcg by mouth once daily  magnesium gluconate (MAG-G) 27 mg (500 mg) tablet Take 500 mg by mouth once daily  memantine  (NAMENDA ) 10 MG tablet Take 1 tablet (10 mg total) by mouth 2 (two) times daily 180 tablet 3  pantoprazole  (PROTONIX ) 40 MG DR tablet Take 1 tablet (40 mg total) by mouth once daily 90 tablet 3  potassium chloride (KLOR-CON M10) 10 mEq ER tablet Take 10 mEq by mouth 2 (two) times daily  timoloL  maleate (TIMOPTIC ) 0.5 % ophthalmic solution Place 1 drop into both eyes 2 (two) times daily   No current facility-administered medications for this visit.   Allergies: Allergies  Allergen Reactions  Flomax [Tamsulosin] Shortness Of Breath and Other (See Comments)  Feels like he will pass out while on it with SOB  Lodine [Etodolac] Anxiety  Depression, ulcers in mouth and suicidal thoughts.    Visit Vitals: Vitals:  05/03/24 0829  BP: 130/80    Review of Systems:  A comprehensive 14 point ROS was performed, reviewed, and the pertinent orthopaedic findings are documented in the HPI.  Physical Exam: General/Constitutional: No apparent distress: well-nourished and well developed. Eyes: Pupils equal, round with synchronous movement. Pulmonary  exam: Lungs clear to auscultation bilaterally no wheezing rales or rhonchi Cardiac exam: Regular rate and rhythm no obvious murmurs rubs or gallops. Integumentary: No impressive skin lesions present, except as noted in detailed exam. Neuro/Psych: Normal mood and affect, oriented to person, place and time.  Comprehensive Knee Exam: Gait Slowed  Alignment Neutral   Inspection Right  Skin Normal appearance with no obvious deformity. No ecchymosis or erythema.  Soft Tissue No focal soft tissue swelling  Quad Atrophy None   Palpation  Right  Tenderness Medial joint line tenderness to palpation  Crepitus + patellofemoral and tibiofemoral crepitus  Effusion None   Range of Motion Right  Flexion 0-100  Extension Full knee extension without hyperextension   Ligamentous Exam Right  Lachman 1+  Valgus 0 Normal  Valgus 30 Normal  Varus 0 Normal  Varus 30 Normal  Anterior Drawer 1+  Posterior Drawer Normal   Meniscal Exam Right  Hyperflexion Test Positive  Hyperextension Test Positive  McMurray's Negative   Neurovascular Right  Quadriceps Strength 5/5  Hamstring Strength 5/5  Hip Abductor Strength 4/5  Distal Motor Normal  Distal Sensory Normal light touch sensation  Distal Pulses Normal    Imaging Studies: I have reviewed AP, lateral,sunrise, and flexed PA weight bearing knee X-rays (4 views) of the right knee ordered and taken today in the office show severe degenerative changes with medial patellofemoral bone-on-bone articulation with tricompartmental osteophyte formation, sclerosis, and early subchondral cystic changes. Kellgren-Lawrence grade 4. AP, sunrise and flexed PA of the left knee show status post total knee arthroplasty with patellar resurfacing patella tracking midline. No evidence of periprosthetic complications.   Assessment:  ICD-10-CM  1. Primary osteoarthritis of right knee M17.11    Plan: Cornie is a 74 year old male presents with right knee bone on  bone arthritis. Based upon the patient's continued symptoms and failure to respond to conservative treatment, I have recommended a right total knee replacement for this patient. A long discussion took place with the patient describing what a total joint replacement is and what the procedure would entail. A knee model, similar to the implants that will be used during the operation, was utilized to demonstrate the implants. Choices of implant manufactures were discussed and reviewed. The ability to secure the implant utilizing cement or cementless (press fit) fixation was discussed. The approach and exposure was discussed.   The hospitalization and post-operative care and rehabilitation were also discussed. The use of perioperative antibiotics and DVT prophylaxis were discussed. The risk, benefits and alternatives to a surgical intervention were discussed at length with the patient. The patient was also advised of risks related to the medical comorbidities and elevated body mass index (BMI). A lengthy discussion took place to review the most common complications including but not limited to: stiffness, loss of function, complex regional pain syndrome, deep vein thrombosis, pulmonary embolus, heart attack, stroke, infection, wound breakdown, numbness, intraoperative fracture, damage to nerves, tendon,muscles, arteries or other blood vessels, death and other possible complications from anesthesia. The patient was told that we will take steps to minimize these risks by using sterile technique, antibiotics and DVT prophylaxis when appropriate and follow the patient postoperatively in the office setting to monitor progress.  The possibility of recurrent pain, no improvement in pain and actual worsening of pain were also discussed with the patient.   Patient asked about and confirms no history of any reactions to metal or metal allergy in the past.  The discharge plan of care focused on the patient going home following  surgery. The patient was encouraged to make the necessary arrangements to have someone stay with them when they are discharged home.   The benefits of surgery were discussed with the patient including the potential for improving the patient's current clinical condition through operative intervention. Alternatives to surgical intervention including continued conservative management were also discussed in detail. All questions were answered to the satisfaction of the patient. The patient participated and agreed to the plan of care as well as the use of the recommended implants for their total knee replacement surgery. An information packet was given to the patient to review prior to surgery.   The patient received clearance for surgery. All questions answered and the patient agrees to the above plan made preparations for right total knee replacement.  Portions of this record have been created using Scientist, clinical (histocompatibility and immunogenetics). Dictation errors have been sought, but may not have been identified and corrected.  Arthea Sheer MD

## 2024-05-30 NOTE — Discharge Summary (Signed)
 Physician Discharge Summary  Patient ID: GRADY MOHABIR MRN: [id number] DOB/AGE: 74-04-1951 74 y.o.  Admit date: 05/30/2024 Discharge date: 05/31/2024  Admission Diagnoses:  Primary osteoarthritis of right knee [M17.11] S/P total knee arthroplasty, right [Z96.651]   Discharge Diagnoses: Patient Active Problem List   Diagnosis Date Noted   S/P total knee arthroplasty, right 05/30/2024   S/P TKR (total knee replacement), left 02/01/2024    Past Medical History:  Diagnosis Date   Actinic keratosis    Aortic atherosclerosis    Barrett's esophagus without dysplasia    Bilateral hydrocele    Bilateral leg numbness    Bradycardia    CAD (coronary artery disease)    Cerebral microvascular disease    Cognitive impairment    a.) on NMDA receptor antagonist (memantine )   Diastolic dysfunction    Dizziness    DOE (dyspnea on exertion)    Erectile dysfunction    a.) on PDE5i (sildenafil)   Erosive esophagitis    Genital herpes    GERD (gastroesophageal reflux disease)    Glaucoma    H/O bilateral inguinal hernia repair    Hepatic cyst    Hiatal hernia    History of kidney stones    History of prostate cancer s/p prostatectomy 2010   Hx of adenomatous colonic polyps    Hyperlipidemia, mixed    Hypertension, essential    Left hydrocele    Long-term use of aspirin  therapy    Lumbar facet arthropathy    Lumbar spondylosis    a.) s/p RIGHT far lateral metrx hemilaminectomy and discectomy (L4-L5) 11/24/2018   Meningioma (HCC)    Multiple pulmonary nodules determined by computed tomography of lung    Nephrolithiasis    OSA on CPAP    Pedal edema    Primary osteoarthritis of left knee    Pulmonary hypertension (HCC)    Raynaud phenomenon    Renal cyst, left    Squamous cell skin cancer      Transfusion: none   Consultants (if any):   Discharged Condition: Improved  Hospital Course: DAMEK ENDE is an 74 y.o. male who was admitted 05/30/2024 with a diagnosis of S/P total  knee arthroplasty, right and went to the operating room on 05/30/2024 and underwent the above named procedures.    Surgeries: Procedures: ARTHROPLASTY, KNEE, TOTAL on 05/30/2024 Patient tolerated the surgery well. Taken to PACU where she was stabilized and then transferred to the orthopedic floor.  Started on Lovenox  30 mg q 12 hrs. TEDs and SCDs applied bilaterally. Heels elevated on bed. No evidence of DVT. Negative Homan. Physical therapy started on day #1 for gait training and transfer. OT started day #1 for ADL and assisted devices.  Patient's IV was d/c on day #1. Patient was able to safely and independently complete all PT goals. PT recommending discharge to home.    On post op day #1 patient was stable and ready for discharge to home with HHPT.  Implants:  Femur: Persona Size 9 CR PPS   Tibia: Persona Size E OsseoTi  Poly: 10mm MC  Patella: 35x9.23mm symmetric   He was given perioperative antibiotics:  Anti-infectives (From admission, onward)    Start     Dose/Rate Route Frequency Ordered Stop   05/30/24 1330  ceFAZolin  (ANCEF ) IVPB 2g/100 mL premix        2 g 200 mL/hr over 30 Minutes Intravenous Every 6 hours 05/30/24 1003 05/31/24 0129   05/30/24 0615  ceFAZolin  (ANCEF ) IVPB 2g/100 mL premix  2 g 200 mL/hr over 30 Minutes Intravenous On call to O.R. 05/30/24 [id number] 05/30/24 0742     .  He was given sequential compression devices, early ambulation, and Lovenox  TEDs for DVT prophylaxis.  He benefited maximally from the hospital stay and there were no complications.    Recent vital signs:  Vitals:   05/30/24 1015 05/30/24 1030  BP: 138/78 120/74  Pulse: 64 63  Resp: (!) 8 11  Temp:  (!) 97.2 F (36.2 C)  SpO2: 98% 96%    Recent laboratory studies:  Lab Results  Component Value Date   HGB 15.5 05/23/2024   HGB 13.4 02/02/2024   HGB 15.8 01/25/2024   Lab Results  Component Value Date   WBC 4.9 05/23/2024   PLT 156 05/23/2024   No results found for:  INR Lab Results  Component Value Date   NA 141 05/23/2024   K 4.5 05/23/2024   CL 103 05/23/2024   CO2 30 05/23/2024   BUN 13 05/23/2024   CREATININE 0.89 05/23/2024   GLUCOSE 107 (H) 05/23/2024    Discharge Medications:   Allergies as of 05/30/2024       Reactions   Lodine [etodolac] Swelling   He says tongue swelled up   Tamsulosin Other (See Comments), Shortness Of Breath   Feels like he will pass out while on it with SOB        Medication List     STOP taking these medications    HYDROcodone -acetaminophen  5-325 MG tablet Commonly known as: NORCO/VICODIN       TAKE these medications    acetaminophen  500 MG tablet Commonly known as: TYLENOL  Take 2 tablets (1,000 mg total) by mouth every 8 (eight) hours. What changed:  when to take this reasons to take this   amLODipine  5 MG tablet Commonly known as: NORVASC  Take 5 mg by mouth every morning.   aspirin  81 MG chewable tablet Chew 81 mg by mouth daily.   cholecalciferol 25 MCG (1000 UNIT) tablet Commonly known as: VITAMIN D3 Take 1,000 Units by mouth daily.   cyanocobalamin 1000 MCG tablet Commonly known as: VITAMIN B12 Take 1,000 mcg by mouth daily.   docusate sodium  100 MG capsule Commonly known as: COLACE Take 1 capsule (100 mg total) by mouth 2 (two) times daily.   enoxaparin  40 MG/0.4ML injection Commonly known as: LOVENOX  Inject 0.4 mLs (40 mg total) into the skin daily.   magnesium gluconate 500 (27 Mg) MG Tabs tablet Commonly known as: MAGONATE Take 500 mg by mouth daily.   memantine  10 MG tablet Commonly known as: NAMENDA  Take 10 mg by mouth 2 (two) times daily.   ondansetron  4 MG tablet Commonly known as: ZOFRAN  Take 1 tablet (4 mg total) by mouth every 6 (six) hours as needed for nausea.   oxyCODONE  5 MG immediate release tablet Commonly known as: Roxicodone  Take 0.5-1 tablets (2.5-5 mg total) by mouth every 6 (six) hours as needed for breakthrough pain.   pantoprazole  40 MG  tablet Commonly known as: PROTONIX  Take 40 mg by mouth every morning.   Potassium 99 MG Tabs Take 99 mg by mouth daily.   sildenafil 20 MG tablet Commonly known as: REVATIO TAKE 2-5 TABLETS BY MOUTH ONCE DAILY AS NEEDED FOR SEXUAL ACTIVITY   timolol  0.5 % ophthalmic solution Commonly known as: TIMOPTIC  Place 1 drop into both eyes 2 (two) times daily.   traMADol  50 MG tablet Commonly known as: ULTRAM  Take 1 tablet (50 mg total) by  mouth every 6 (six) hours as needed for moderate pain (pain score 4-6).               Durable Medical Equipment  (From admission, onward)           Start     Ordered   05/30/24 1215  For home use only DME 3 n 1  Once        05/30/24 1215   05/30/24 1215  For home use only DME Walker rolling  Once       Question Answer Comment  Walker: With 5 Inch Wheels   Patient needs a walker to treat with the following condition Total knee replacement status, right      05/30/24 1215            Diagnostic Studies: MR BRAIN WO CONTRAST Result Date: 05/22/2024 EXAM: MRI BRAIN WITHOUT CONTRAST 05/10/2024 07:40:07 AM TECHNIQUE: Multiplanar multisequence MRI of the head/brain was performed without the administration of intravenous contrast. COMPARISON: MR Head Without and With Contrast 07/07/2023. CLINICAL HISTORY: Follow up on meningioma. FINDINGS: BRAIN AND VENTRICLES: No acute infarct. No intracranial hemorrhage. No midline shift. No hydrocephalus. Grossly stable extra-axial, dural based mass along the right cavernous sinus with extension along the right clivus and tentorial leaflet and involving the right Meckel's cave, but evaluation is limited without contrast. Normal flow voids. ORBITS: No acute abnormality. SINUSES AND MASTOIDS: No acute abnormality. BONES AND SOFT TISSUES: Normal marrow signal. No acute soft tissue abnormality. IMPRESSION: 1. Grossly stable meningioma, but evaluation is limited without contrast. Recommend post-contrast imaging if the  patient is able. Electronically signed by: Gilmore Molt 05/22/2024 08:15 PM EST RP Workstation: [id number]    Disposition:      Follow-up Information     Charlene Debby BROCKS, PA-C Follow up in 2 week(s).   Specialties: Orthopedic Surgery, Emergency Medicine Contact information: [address] [place] [address] [phone]                  Signed: Debby BROCKS Charlene 05/30/2024, 12:21 PM

## 2024-05-30 NOTE — Plan of Care (Signed)

## 2024-05-30 NOTE — Interval H&P Note (Signed)
Patient history and physical updated. Consent reviewed including risks, benefits, and alternatives to surgery. Patient agrees with above plan to proceed with right total knee arthroplasty  

## 2024-05-30 NOTE — Discharge Instructions (Signed)
 Instructions after Total Knee Replacement   Vincent Patton M.D.     Dept. of Orthopaedics & Sports Medicine  [facility]  [address]  [place], [place]  [address]  Phone: [phone]   Fax: [phone]    DIET: Drink plenty of non-alcoholic fluids. Resume your normal diet. Include foods high in fiber.  ACTIVITY:  You may use crutches or a walker with weight-bearing as tolerated, unless instructed otherwise. You may be weaned off of the walker or crutches by your Physical Therapist.  Do NOT place pillows under the knee. Anything placed under the knee could limit your ability to straighten the knee.   Continue doing gentle exercises. Exercising will reduce the pain and swelling, increase motion, and prevent muscle weakness.   Please continue to use the TED compression stockings for 2 weeks. You may remove the stockings at night, but should reapply them in the morning. Do not drive or operate any equipment until instructed.  WOUND CARE:  Continue to use the PolarCare or ice packs periodically to reduce pain and swelling. You may begin showering 3 days after surgery with honeycomb dressing. Remove honeycomb dressing 7 days after surgery and continue showering. Allow dermabond to fall off on its own.  MEDICATIONS: You may resume your regular medications. Please take the pain medication as prescribed on the medication. Do not take pain medication on an empty stomach. You have been given a prescription for a blood thinner (Lovenox or Coumadin). Please take the medication as instructed. (NOTE: After completing a 2 week course of Lovenox, take one 81 mg Enteric-coated aspirin twice a day for 3 additional weeks. This along with elevation will help reduce the possibility of phlebitis in your operated leg.) Do not drive or drink alcoholic beverages when taking pain medications.  POSTOPERATIVE CONSTIPATION PROTOCOL Constipation - defined medically as fewer than three stools per  week and severe constipation as less than one stool per week.  One of the most common issues patients have following surgery is constipation.  Even if you have a regular bowel pattern at home, your normal regimen is likely to be disrupted due to multiple reasons following surgery.  Combination of anesthesia, postoperative narcotics, change in appetite and fluid intake all can affect your bowels.  In order to avoid complications following surgery, here are some recommendations in order to help you during your recovery period.  Colace (docusate) - Pick up an over-the-counter form of Colace or another stool softener and take twice a day as long as you are requiring postoperative pain medications.  Take with a full glass of water daily.  If you experience loose stools or diarrhea, hold the colace until you stool forms back up.  If your symptoms do not get better within 1 week or if they get worse, check with your doctor.  Dulcolax (bisacodyl) - Pick up over-the-counter and take as directed by the product packaging as needed to assist with the movement of your bowels.  Take with a full glass of water.  Use this product as needed if not relieved by Colace only.   MiraLax (polyethylene glycol) - Pick up over-the-counter to have on hand.  MiraLax is a solution that will increase the amount of water in your bowels to assist with bowel movements.  Take as directed and can mix with a glass of water, juice, soda, coffee, or tea.  Take if you go more than two days without a movement. Do not use MiraLax more than once per day.  Call your doctor if you are still constipated or irregular after using this medication for 7 days in a row.  If you continue to have problems with postoperative constipation, please contact the office for further assistance and recommendations.  If you experience "the worst abdominal pain ever" or develop nausea or vomiting, please contact the office immediatly for further recommendations for  treatment.   CALL THE OFFICE FOR: Temperature above 101 degrees Excessive bleeding or drainage on the dressing. Excessive swelling, coldness, or paleness of the toes. Persistent nausea and vomiting.  FOLLOW-UP:  You should have an appointment to return to the office in 14 days after surgery. Arrangements have been made for continuation of Physical Therapy (either home therapy or outpatient therapy).

## 2024-05-30 NOTE — TOC Transition Note (Signed)
 Transition of Care [facility]) - Discharge Note   Patient Details  Name: Vincent Patton MRN: [id number] Date of Birth: January 15, 1951  [facility]) CM/SW Contact:  Victory Jackquline RAMAN, RN Phone Number: 05/30/2024, 11:48 AM   Clinical Narrative:    Patient discharging home with [facility] for PT/OT that was arranged by Dr. Mal office. Patient has DME at home from previous surgery (RW & BSC). Patient's spouse ([name]) will be picking him up at the time of discharge. No further concerns. RNCM signing off.  Final next level of care: Home w Home Health Services Barriers to Discharge: Barriers Resolved   Patient Goals and CMS Choice            Discharge Placement                Patient to be transferred to facility by: Spouse Name of family member notified: [name] Patient and family notified of of transfer: 05/30/24  Discharge Plan and Services Additional resources added to the After Visit Summary for                                    Representative spoke with at [facility]: HH/PT was sset up via MD's office prior to surgery. ([facility])  Social Drivers of Health (SDOH) Interventions SDOH Screenings   Food Insecurity: No Food Insecurity (05/03/2024)   Received from [facility]  Housing: Low Risk  (05/03/2024)   Received from [facility]  Transportation Needs: No Transportation Needs (05/03/2024)   Received from [facility]  Utilities: Not At Risk (05/03/2024)   Received from [facility]  Financial Resource Strain: Low Risk  (05/03/2024)   Received from [facility]  Tobacco Use: Medium Risk (05/30/2024)     Readmission Risk Interventions     No data to display

## 2024-05-30 NOTE — Evaluation (Signed)
 Physical Therapy Evaluation Patient Details Name: Vincent Patton MRN: [id number] DOB: [birth year] Today's Date: 05/30/2024  History of Present Illness  Patient is a 74 year old male with right knee osteoarthritis s/p right total knee arthroplasty. History of left total knee arthroplasty  Clinical Impression  Patient is agreeable to PT evaluation. He reports recovering well from left knee replacement surgery and has been walking without assistive device. He lives with his spouse and has DME in place already at home.  Today the patient is Mod I with bed mobility. Sitting balance is good. He was able to stand with minimal lifting assistance required. Standing balance is fair with UE supported on rolling walker. Patient declined walking but performed pre gait activity in preparation for walking next session. He reports the feeling of mild right knee buckling with weight bearing, however this was not visualized by therapist while standing. He is hopeful for return home tomorrow. PT will continue to follow to maximize independence and decrease caregiver burden.       If plan is discharge home, recommend the following: Assist for transportation;Help with stairs or ramp for entrance   Can travel by private vehicle        Equipment Recommendations None recommended by PT  Recommendations for Other Services       Functional Status Assessment Patient has had a recent decline in their functional status and demonstrates the ability to make significant improvements in function in a reasonable and predictable amount of time.     Precautions / Restrictions Precautions Precautions: Knee Precaution Booklet Issued: Yes (comment) Recall of Precautions/Restrictions: Intact Restrictions Weight Bearing Restrictions Per Provider Order: Yes RLE Weight Bearing Per Provider Order: Weight bearing as tolerated      Mobility  Bed Mobility Overal bed mobility: Modified Independent             General bed  mobility comments: increased time    Transfers Overall transfer level: Needs assistance Equipment used: Rolling walker (2 wheels) Transfers: Sit to/from Stand Sit to Stand: Min assist           General transfer comment: lifting assistance for standing.    Ambulation/Gait             Pre-gait activities: weight shifting with CGA using rolling walker for support. no dizziness reported General Gait Details: patient declined walking. emphasis on weight shifting in standing in preparation for walking. patient reports feeling of mild right knee buckling but not visualized with standing  Stairs            Wheelchair Mobility     Tilt Bed    Modified Rankin (Stroke Patients Only)       Balance Overall balance assessment: Needs assistance Sitting-balance support: Feet supported Sitting balance-Leahy Scale: Good     Standing balance support: Bilateral upper extremity supported Standing balance-Leahy Scale: Fair Standing balance comment: with RW for UE support in standing                             Pertinent Vitals/Pain Pain Assessment Pain Assessment: No/denies pain    Home Living Family/patient expects to be discharged to:: Private residence Living Arrangements: Spouse/significant other Available Help at Discharge: Family;Available 24 hours/day Type of Home: House Home Access: Stairs to enter Entrance Stairs-Rails: Right Entrance Stairs-Number of Steps: 4   Home Layout: One level Home Equipment: Grab bars - tub/shower;Toilet riser;Shower seat - built [employer] (2 wheels);BSC/3in1  Prior Function Prior Level of Function : Independent/Modified Independent             Mobility Comments: no device       Extremity/Trunk Assessment   Upper Extremity Assessment Upper Extremity Assessment: Overall WFL for tasks assessed    Lower Extremity Assessment Lower Extremity Assessment: RLE deficits/detail RLE Deficits / Details:  SLR independently. can activate hip/knee/ankle movement RLE Sensation: WNL       Communication   Communication Communication: No apparent difficulties    Cognition Arousal: Alert Behavior During Therapy: WFL for tasks assessed/performed   PT - Cognitive impairments: No apparent impairments                       PT - Cognition Comments: patient reports he has dementia. alert and oriented, able to follow all commands without difficulty Following commands: Intact       Cueing Cueing Techniques: Verbal cues     General Comments General comments (skin integrity, edema, etc.): education on importance of RLE positioning to promote knee ROM, using polar care for edema/pain management    Exercises Total Joint Exercises Goniometric ROM: R knee flexion 80 degrees in sitting   Assessment/Plan    PT Assessment Patient needs continued PT services  PT Problem List Decreased strength;Decreased range of motion;Decreased activity tolerance;Decreased balance;Decreased mobility       PT Treatment Interventions DME instruction;Gait training;Stair training;Functional mobility training;Therapeutic activities;Therapeutic exercise;Balance training    PT Goals (Current goals can be found in the Care Plan section)  Acute Rehab PT Goals Patient Stated Goal: home tomorrow PT Goal Formulation: With patient Time For Goal Achievement: 06/13/24 Potential to Achieve Goals: Good    Frequency BID     Co-evaluation               AM-PAC PT 6 Clicks Mobility  Outcome Measure Help needed turning from your back to your side while in a flat bed without using bedrails?: None Help needed moving from lying on your back to sitting on the side of a flat bed without using bedrails?: A Little Help needed moving to and from a bed to a chair (including a wheelchair)?: A Little Help needed standing up from a chair using your arms (e.g., wheelchair or bedside chair)?: A Little Help needed to  walk in hospital room?: A Little Help needed climbing 3-5 steps with a railing? : A Little 6 Click Score: 19    End of Session   Activity Tolerance: Patient tolerated treatment well Patient left: in bed;with call bell/phone within reach;with bed alarm set Nurse Communication: Mobility status PT Visit Diagnosis: Difficulty in walking, not elsewhere classified (R26.2);Other abnormalities of gait and mobility (R26.89)    Time: 1419-1440 PT Time Calculation (min) (ACUTE ONLY): 21 min   Charges:   PT Evaluation $PT Eval Low Complexity: 1 Low   PT General Charges $$ ACUTE PT VISIT: 1 Visit        Randine Essex, PT, MPT   Randine LULLA Essex 05/30/2024, 2:52 PM

## 2024-05-30 NOTE — Progress Notes (Signed)
 Patient is not able to walk the distance required to go the bathroom, or he is unable to safely negotiate stairs required to access the bathroom.  A 3in1 BSC will alleviate this problem.        Lollie Marrow, PA-C [facility]

## 2024-05-31 ENCOUNTER — Ambulatory Visit: Admitting: Urology

## 2024-05-31 ENCOUNTER — Encounter: Payer: Self-pay | Admitting: Orthopedic Surgery

## 2024-05-31 DIAGNOSIS — M1711 Unilateral primary osteoarthritis, right knee: Secondary | ICD-10-CM | POA: Diagnosis not present

## 2024-05-31 LAB — BASIC METABOLIC PANEL WITH GFR
Anion gap: 6 (ref 5–15)
BUN: 14 mg/dL (ref 8–23)
CO2: 27 mmol/L (ref 22–32)
Calcium: 9 mg/dL (ref 8.9–10.3)
Chloride: 107 mmol/L (ref 98–111)
Creatinine, Ser: 0.82 mg/dL (ref 0.61–1.24)
GFR, Estimated: >60 mL/min
Glucose, Bld: 128 mg/dL — ABNORMAL HIGH (ref 70–99)
Potassium: 4.1 mmol/L (ref 3.5–5.1)
Sodium: 139 mmol/L (ref 135–145)

## 2024-05-31 LAB — CBC
HCT: 38.6 % — ABNORMAL LOW (ref 39.0–52.0)
Hemoglobin: 12.9 g/dL — ABNORMAL LOW (ref 13.0–17.0)
MCH: 30.2 pg (ref 26.0–34.0)
MCHC: 33.4 g/dL (ref 30.0–36.0)
MCV: 90.4 fL (ref 80.0–100.0)
Platelets: 134 K/uL — ABNORMAL LOW (ref 150–400)
RBC: 4.27 MIL/uL (ref 4.22–5.81)
RDW: 12.9 % (ref 11.5–15.5)
WBC: 8.2 K/uL (ref 4.0–10.5)
nRBC: 0 % (ref 0.0–0.2)

## 2024-05-31 MED ORDER — AMLODIPINE BESYLATE 5 MG PO TABS
ORAL_TABLET | ORAL | Status: AC
  Filled 2024-05-31: qty 1

## 2024-05-31 NOTE — Plan of Care (Signed)
   Problem: Activity: Goal: Ability to avoid complications of mobility impairment will improve Outcome: Progressing   Problem: Pain Management: Goal: Pain level will decrease with appropriate interventions Outcome: Progressing

## 2024-05-31 NOTE — Anesthesia Postprocedure Evaluation (Signed)
"   Anesthesia Post Note  Patient: Vincent Patton  Procedure(s) Performed: ARTHROPLASTY, KNEE, TOTAL (Right: Knee)  Patient location during evaluation: Short Stay Anesthesia Type: Combined General/Spinal Level of consciousness: awake and alert and oriented Pain management: satisfactory to patient Vital Signs Assessment: post-procedure vital signs reviewed and stable Respiratory status: spontaneous breathing Cardiovascular status: stable Postop Assessment: patient able to bend at knees, no apparent nausea or vomiting, adequate PO intake and able to ambulate Anesthetic complications: no Comments: Has been able to void.   No notable events documented.   Last Vitals:  Vitals:   05/31/24 0543 05/31/24 0544  BP: 124/70 124/70  Pulse: 63   Resp: 18   Temp:    SpO2: 95%     Last Pain:  Vitals:   05/31/24 0300  TempSrc:   PainSc: Asleep                 [name]      "

## 2024-05-31 NOTE — Progress Notes (Signed)
 "  Subjective: 1 Day Post-Op Procedures (LRB): ARTHROPLASTY, KNEE, TOTAL (Right) Patient reports pain as mild.   Patient is well, and has had no acute complaints or problems Denies any CP, SOB, ABD pain. We will continue therapy today.  Plan is to go Home after hospital stay.  Objective: Vital signs in last 24 hours: Temp:  [96.4 F (35.8 C)-98 F (36.7 C)] 97.6 F (36.4 C) (01/13 0802) Pulse Rate:  [59-68] 59 (01/13 0802) Resp:  [8-18] 18 (01/13 0802) BP: (106-148)/(60-87) 148/78 (01/13 0802) SpO2:  [94 %-99 %] 99 % (01/13 0802)  Intake/Output from previous day: 01/12 0701 - 01/13 0700 In: 2808.1 [P.O.:480; I.V.:1928.1; IV Piggyback:400] Out: 2165 [Urine:2115; Blood:50] Intake/Output this shift: No intake/output data recorded.  Recent Labs    05/31/24 0626  HGB 12.9*   Recent Labs    05/31/24 0626  WBC 8.2  RBC 4.27  HCT 38.6*  PLT 134*   Recent Labs    05/31/24 0626  NA 139  K 4.1  CL 107  CO2 27  BUN 14  CREATININE 0.82  GLUCOSE 128*  CALCIUM 9.0   No results for input(s): LABPT, INR in the last 72 hours.  EXAM General - Patient is Alert, Appropriate, and Oriented Extremity - Neurovascular intact Sensation intact distally Intact pulses distally Dorsiflexion/Plantar flexion intact Dressing - dressing C/D/I and no drainage Motor Function - intact, moving foot and toes well on exam.   Past Medical History:  Diagnosis Date   Actinic keratosis    Aortic atherosclerosis    Barrett's esophagus without dysplasia    Bilateral hydrocele    Bilateral leg numbness    Bradycardia    CAD (coronary artery disease)    Cerebral microvascular disease    Cognitive impairment    a.) on NMDA receptor antagonist (memantine )   Diastolic dysfunction    Dizziness    DOE (dyspnea on exertion)    Erectile dysfunction    a.) on PDE5i (sildenafil)   Erosive esophagitis    Genital herpes    GERD (gastroesophageal reflux disease)    Glaucoma    H/O  bilateral inguinal hernia repair    Hepatic cyst    Hiatal hernia    History of kidney stones    History of prostate cancer s/p prostatectomy 2010   Hx of adenomatous colonic polyps    Hyperlipidemia, mixed    Hypertension, essential    Left hydrocele    Long-term use of aspirin  therapy    Lumbar facet arthropathy    Lumbar spondylosis    a.) s/p RIGHT far lateral metrx hemilaminectomy and discectomy (L4-L5) 11/24/2018   Meningioma (HCC)    Multiple pulmonary nodules determined by computed tomography of lung    Nephrolithiasis    OSA on CPAP    Pedal edema    Primary osteoarthritis of left knee    Pulmonary hypertension (HCC)    Raynaud phenomenon    Renal cyst, left    Squamous cell skin cancer     Assessment/Plan:   1 Day Post-Op Procedures (LRB): ARTHROPLASTY, KNEE, TOTAL (Right) Principal Problem:   S/P total knee arthroplasty, right  Estimated body mass index is 24.13 kg/m as calculated from the following:   Height as of this encounter: 6' (1.829 m).   Weight as of this encounter: 80.7 kg. Advance diet Up with therapy Pain well controlled Labs and VSS CM to assist with discharge to home with HHPT today  DVT Prophylaxis - Lovenox , TED hose, and SCDs  Weight-Bearing as tolerated to right leg   T. Medford Amber, PA-C [facility] 05/31/2024, 8:10 AM   "

## 2024-05-31 NOTE — Progress Notes (Signed)
 Physical Therapy Treatment Patient Details Name: Vincent Patton MRN: [id number] DOB: 04-24-1951 Today's Date: 05/31/2024   History of Present Illness Patient is a 74 year old male with right knee osteoarthritis s/p right total knee arthroplasty. History of left total knee arthroplasty    PT Comments  Pt was A and O x 4. Eager to participate and remains motivated throughout. Pt was easily able to exit bed, stand to RW, and tolerate ambulation >200 ft. No LOB or safety concerns. He demonstrated > 95 degrees flexion after gait training. Knee extension also progressing well. Pt will benefit from continued skilled PT at DC to maximize independence and safety with all ADLs. HHPT recommended.    If plan is discharge home, recommend the following: Assist for transportation;Help with stairs or ramp for entrance     Equipment Recommendations  None recommended by PT       Precautions / Restrictions Precautions Precautions: Knee Precaution Booklet Issued: Yes (comment) Recall of Precautions/Restrictions: Intact Restrictions Weight Bearing Restrictions Per Provider Order: Yes RLE Weight Bearing Per Provider Order: Weight bearing as tolerated     Mobility  Bed Mobility Overal bed mobility: Modified Independent   Transfers Overall transfer level: Needs assistance Equipment used: Rolling walker (2 wheels) Transfers: Sit to/from Stand Sit to Stand: Supervision   Ambulation/Gait Ambulation/Gait assistance: Supervision Gait Distance (Feet): 200 Feet Assistive device: Rolling walker (2 wheels) Gait Pattern/deviations: Step-to pattern, Step-through pattern, Antalgic Gait velocity: decreased  General Gait Details: Pt demoinstrated safe abilities to ambulate > 200 ft with RW   Balance Overall balance assessment: Needs assistance Sitting-balance support: Feet supported Sitting balance-Leahy Scale: Good     Standing balance support: Bilateral upper extremity supported Standing balance-Leahy  Scale: Good       Communication Communication Communication: No apparent difficulties  Cognition Arousal: Alert Behavior During Therapy: WFL for tasks assessed/performed   PT - Cognitive impairments: No apparent impairments      PT - Cognition Comments: Pt is A and O x 4 Following commands: Intact      Cueing Cueing Techniques: Verbal cues  Exercises Total Joint Exercises Goniometric ROM: R knee ROM perform with pt easily able to flex > 95 degrees    General Comments General comments (skin integrity, edema, etc.): Pt was able to tolerate stretching and ther ex well. Discussed importance of polar care use, ambulation, HHPT recommendations/ expectations      Pertinent Vitals/Pain Pain Assessment Pain Assessment: 0-10 Pain Score: 2      PT Goals (current goals can now be found in the care plan section) Acute Rehab PT Goals Patient Stated Goal: home today Progress towards PT goals: Progressing toward goals    Frequency    BID       AM-PAC PT 6 Clicks Mobility   Outcome Measure  Help needed turning from your back to your side while in a flat bed without using bedrails?: None Help needed moving from lying on your back to sitting on the side of a flat bed without using bedrails?: A Little Help needed moving to and from a bed to a chair (including a wheelchair)?: A Little Help needed standing up from a chair using your arms (e.g., wheelchair or bedside chair)?: A Little Help needed to walk in hospital room?: A Little Help needed climbing 3-5 steps with a railing? : A Little 6 Click Score: 19    End of Session   Activity Tolerance: Patient tolerated treatment well Patient left: in chair;with call bell/phone within reach  Nurse Communication: Mobility status PT Visit Diagnosis: Difficulty in walking, not elsewhere classified (R26.2);Other abnormalities of gait and mobility (R26.89)     Time: 9259-9193 PT Time Calculation (min) (ACUTE ONLY): 26 min  Charges:     $Gait Training: 8-22 mins $Therapeutic Exercise: 8-22 mins PT General Charges $$ ACUTE PT VISIT: 1 Visit                    Rankin Essex PTA 05/31/2024, 8:41 AM

## 2024-05-31 NOTE — Progress Notes (Signed)
 DISCHARGE NOTE:  Pt and wife given discharge instructions and verbalized understanding. TED hose on both legs. Meds to beds medications sent with pt. Pt wheeled to car by staff, wife providing transportation home.

## 2024-05-31 NOTE — TOC Transition Note (Signed)
 Transition of Care [facility]) - Discharge Note   Patient Details  Name: Vincent Patton MRN: [id number] Date of Birth: 1951/05/03  [facility] CM/SW Contact:  Lauraine JAYSON Carpen, LCSW Phone Number: 05/31/2024, 9:00 AM   Clinical Narrative:  Patient has orders to discharge home today. [facility] liaison is aware. No further concerns. CSW signing off.   Final next level of care: Home w Home Health Services Barriers to Discharge: Barriers Resolved   Patient Goals and CMS Choice            Discharge Placement                Patient to be transferred to facility by: Wife Name of family member notified: [name] Patient and family notified of of transfer: 05/31/24  Discharge Plan and Services Additional resources added to the After Visit Summary for                            [facility] Arranged: PT, OT HH Agency: [facility] Date [facility] Agency Contacted: 05/31/24   Representative spoke with at [facility] Agency: [place]   Social Drivers of Health (SDOH) Interventions SDOH Screenings   Food Insecurity: No Food Insecurity (05/03/2024)   Received from [facility]  Housing: Low Risk  (05/03/2024)   Received from [facility]  Transportation Needs: No Transportation Needs (05/03/2024)   [facility] from [facility]  Utilities: Not At Risk (05/03/2024)   Received from [facility]  Financial Resource Strain: Low Risk  (05/03/2024)   Received from [facility]  Tobacco Use: Medium Risk (05/30/2024)     Readmission Risk Interventions     No data to display

## 2024-05-31 NOTE — Plan of Care (Signed)
  Problem: Clinical Measurements: Goal: Postoperative complications will be avoided or minimized Outcome: Progressing   

## 2024-06-02 ENCOUNTER — Ambulatory Visit: Payer: Self-pay | Admitting: Urology

## 2024-06-15 NOTE — Anesthesia Postprocedure Evaluation (Signed)
"   Anesthesia Post Note  Patient: Vincent Patton  Procedure(s) Performed: ARTHROPLASTY, KNEE, TOTAL (Right: Knee)  Patient location during evaluation: PACU Anesthesia Type: Combined General/Spinal Level of consciousness: awake Pain management: satisfactory to patient Vital Signs Assessment: post-procedure vital signs reviewed and stable Respiratory status: spontaneous breathing Cardiovascular status: stable Anesthetic complications: no   No notable events documented.   Last Vitals:  Vitals:   05/31/24 0924 05/31/24 1122  BP:  134/78  Pulse: 63 60  Resp:  18  Temp:  36.8 C  SpO2:  100%    Last Pain:  Vitals:   05/31/24 1122  TempSrc: Oral  PainSc: 0-No pain                 Myca Perno GORMAN Balloon      "

## 2024-07-12 ENCOUNTER — Ambulatory Visit: Admitting: Urology
